# Patient Record
Sex: Female | Born: 1965 | Race: White | Hispanic: No | Marital: Married | Smoking: Former smoker
Health system: Southern US, Community
[De-identification: ages and names within clinical notes are randomized; demographics above are authoritative.]

## PROBLEM LIST (undated history)

## (undated) DIAGNOSIS — C801 Malignant (primary) neoplasm, unspecified: Secondary | ICD-10-CM

## (undated) DIAGNOSIS — Z803 Family history of malignant neoplasm of breast: Secondary | ICD-10-CM

## (undated) DIAGNOSIS — G971 Other reaction to spinal and lumbar puncture: Secondary | ICD-10-CM

## (undated) DIAGNOSIS — Z800 Family history of malignant neoplasm of digestive organs: Secondary | ICD-10-CM

## (undated) HISTORY — PX: BREAST ENHANCEMENT SURGERY: SHX7

## (undated) HISTORY — PX: ABDOMINAL HYSTERECTOMY: SHX81

## (undated) HISTORY — PX: BREAST SURGERY: SHX581

## (undated) HISTORY — PX: WISDOM TOOTH EXTRACTION: SHX21

## (undated) HISTORY — DX: Family history of malignant neoplasm of digestive organs: Z80.0

## (undated) HISTORY — PX: TONSILLECTOMY: SUR1361

## (undated) HISTORY — DX: Family history of malignant neoplasm of breast: Z80.3

---

## 2014-12-01 ENCOUNTER — Ambulatory Visit: Payer: Self-pay | Admitting: Family Medicine

## 2014-12-02 ENCOUNTER — Encounter: Payer: Self-pay | Admitting: Family Medicine

## 2015-01-11 HISTORY — PX: AUGMENTATION MAMMAPLASTY: SUR837

## 2015-01-19 ENCOUNTER — Other Ambulatory Visit: Payer: Self-pay | Admitting: Obstetrics and Gynecology

## 2015-01-19 DIAGNOSIS — N6325 Unspecified lump in the left breast, overlapping quadrants: Secondary | ICD-10-CM

## 2015-01-19 DIAGNOSIS — N6320 Unspecified lump in the left breast, unspecified quadrant: Principal | ICD-10-CM

## 2015-01-27 ENCOUNTER — Other Ambulatory Visit: Payer: Self-pay | Admitting: Obstetrics and Gynecology

## 2015-01-27 ENCOUNTER — Ambulatory Visit
Admission: RE | Admit: 2015-01-27 | Discharge: 2015-01-27 | Disposition: A | Discharge status: Home / Self Care | Payer: Federal, State, Local not specified - PPO | Source: Ambulatory Visit | Attending: Obstetrics and Gynecology | Admitting: Obstetrics and Gynecology

## 2015-01-27 DIAGNOSIS — N6489 Other specified disorders of breast: Secondary | ICD-10-CM

## 2015-01-27 DIAGNOSIS — N6320 Unspecified lump in the left breast, unspecified quadrant: Principal | ICD-10-CM

## 2015-01-27 DIAGNOSIS — N6325 Unspecified lump in the left breast, overlapping quadrants: Secondary | ICD-10-CM

## 2015-01-29 ENCOUNTER — Ambulatory Visit
Admission: RE | Admit: 2015-01-29 | Discharge: 2015-01-29 | Disposition: A | Discharge status: Home / Self Care | Payer: No Typology Code available for payment source | Source: Ambulatory Visit | Attending: Obstetrics and Gynecology | Admitting: Obstetrics and Gynecology

## 2015-01-29 ENCOUNTER — Other Ambulatory Visit: Payer: Self-pay

## 2015-01-29 ENCOUNTER — Ambulatory Visit
Admission: RE | Admit: 2015-01-29 | Discharge: 2015-01-29 | Disposition: A | Discharge status: Home / Self Care | Payer: Federal, State, Local not specified - PPO | Source: Ambulatory Visit | Attending: Obstetrics and Gynecology | Admitting: Obstetrics and Gynecology

## 2015-01-29 ENCOUNTER — Other Ambulatory Visit: Payer: Self-pay | Admitting: Obstetrics and Gynecology

## 2015-01-29 DIAGNOSIS — N6489 Other specified disorders of breast: Secondary | ICD-10-CM

## 2015-02-04 ENCOUNTER — Other Ambulatory Visit: Payer: Self-pay | Admitting: General Surgery

## 2015-02-04 ENCOUNTER — Encounter: Payer: Self-pay | Admitting: Genetic Counselor

## 2015-02-04 DIAGNOSIS — C50912 Malignant neoplasm of unspecified site of left female breast: Secondary | ICD-10-CM

## 2015-02-06 ENCOUNTER — Ambulatory Visit
Admission: RE | Admit: 2015-02-06 | Discharge: 2015-02-06 | Disposition: A | Discharge status: Home / Self Care | Payer: Federal, State, Local not specified - PPO | Source: Ambulatory Visit | Attending: General Surgery | Admitting: General Surgery

## 2015-02-06 MED ORDER — GADOBENATE DIMEGLUMINE 529 MG/ML IV SOLN
10.0000 mL | Freq: Once | INTRAVENOUS | Status: AC | PRN
  Administered 2015-02-06: 10 mL via INTRAVENOUS

## 2015-02-11 ENCOUNTER — Telehealth: Payer: Self-pay | Admitting: Hematology

## 2015-02-11 NOTE — Telephone Encounter (Signed)
new breast appt-s/w patient and gave np appt for 02/02 @ 11 w/Dr. Burr Medico.  Referring Dr. Marlou Starks Dx- Invasive breast ca/left breast.    Information scanned under media tab.

## 2015-02-12 ENCOUNTER — Ambulatory Visit (HOSPITAL_BASED_OUTPATIENT_CLINIC_OR_DEPARTMENT_OTHER): Payer: Federal, State, Local not specified - PPO | Admitting: Hematology

## 2015-02-12 ENCOUNTER — Encounter: Payer: Self-pay | Admitting: *Deleted

## 2015-02-12 ENCOUNTER — Encounter: Payer: Self-pay | Admitting: Hematology

## 2015-02-12 VITALS — BP 119/73 | HR 84 | Temp 98.9°F | Resp 17 | Ht 61.0 in | Wt 116.8 lb

## 2015-02-12 DIAGNOSIS — C50512 Malignant neoplasm of lower-outer quadrant of left female breast: Secondary | ICD-10-CM | POA: Insufficient documentation

## 2015-02-12 NOTE — Progress Notes (Signed)
[facility]  [phone] [phone]  Clinic New Consult Note   Patient Care Team: No Pcp Per Patient as PCP - General (General Practice) 02/12/2015  REFERRAL PHYSICIAN: Dr. Toth   CHIEF COMPLAINTS/PURPOSE OF CONSULTATION:  Newly diagnosed left breast cancer   HISTORY OF PRESENTING ILLNESS:  Joann Collins 50 y.o. female is here because of her newly diagnosed left breast cancer.   She felt a breast lump 2 month ago, no pain, skin change or discharge, nipple change. She otherwise feels well, denies any other symptoms. Her last screening mammogram was normal in April 2014. She moved from [place] to [place] about 6 months ago. Due to the breast lump, she went to see a gynecologist, and a mammogram was done on 01/27/2015. The mammogram and ultrasound showed a 1.1 cm cyst at 2:00 position which corresponding to the palpable mass, and an area of architectural distortion at the 2 to 3:00 position of the left breast. She underwent ultrasound-guided core needle biopsy of the left breast lesion at 3 clock position. She developed some pain at the biopsy site, and mild skin erythema, mild numbness of the left upper arm.   She was referred to see her breast surgeon Dr. Toth. Dr. Toth ordered a breast MRI and discussed the MRI findings with patient. Mastectomy was recommended which is also favored by patient.   MEDICAL HISTORY:  History reviewed. No pertinent past medical history.  SURGICAL HISTORY: Past Surgical History  Procedure Laterality Date  . Abdominal hysterectomy      GYN HISTORY  Menarchal: 14 LMP: 2000 before she had hysterectomy Contraceptive: 4 years  HRT: none  G2P2:   SOCIAL HISTORY: Social History   Social History  . Marital Status: Married    Spouse Name: N/A  . Number of Children:  she has a son and a daughter who is 19   . Years of Education: N/A   Occupational History  . Not on file.   Social History Main  Topics  . Smoking status: Former Smoker -- 1.00 packs/day for 30 years  . Smokeless tobacco: Not on file  . Alcohol Use: Yes     Comment: socail   . Drug Use: No  . Sexual Activity: Not on file   Other Topics Concern  . Not on file   Social History Narrative   She works for [employer], she is a customer service manager, she works from home   FAMILY HISTORY: Family History  Problem Relation Age of Onset  . Breast cancer  60  . Breast cancer Maternal Aunt 45  . Breast cancer Maternal Aunt 83    ALLERGIES:  is allergic to penicillins.  MEDICATIONS:  Current Outpatient Prescriptions  Medication Sig Dispense Refill  . acyclovir (ZOVIRAX) 800 MG tablet Take 800 mg by mouth daily.     No current facility-administered medications for this visit.    REVIEW OF SYSTEMS:   Constitutional: Denies fevers, chills or abnormal night sweats Eyes: Denies blurriness of vision, double vision or watery eyes Ears, nose, mouth, throat, and face: Denies mucositis or sore throat Respiratory: Denies cough, dyspnea or wheezes Cardiovascular: Denies palpitation, chest discomfort or lower extremity swelling Gastrointestinal:  Denies nausea, heartburn or change in bowel habits Skin: Denies abnormal skin rashes Lymphatics: Denies new lymphadenopathy or easy bruising Neurological:Denies numbness, tingling or new weaknesses Behavioral/Psych: Mood is stable, no new changes  All other systems were reviewed with the patient and are negative.  PHYSICAL EXAMINATION: ECOG PERFORMANCE   STATUS: 0 - Asymptomatic  Filed Vitals:   02/12/15 1049  BP: 119/73  Pulse: 84  Temp: 98.9 F (37.2 C)  Resp: 17   Filed Weights   02/12/15 1049  Weight: 116 lb 12.8 oz (52.98 kg)    GENERAL:alert, no distress and comfortable SKIN: skin color, texture, turgor are normal, no rashes or significant lesions EYES: normal, conjunctiva are pink and non-injected, sclera clear OROPHARYNX:no exudate, no erythema and lips,  buccal mucosa, and tongue normal  NECK: supple, thyroid normal size, non-tender, without nodularity LYMPH:  no palpable lymphadenopathy in the cervical, axillary or inguinal LUNGS: clear to auscultation and percussion with normal breathing effort HEART: regular rate & rhythm and no murmurs and no lower extremity edema ABDOMEN:abdomen soft, non-tender and normal bowel sounds Musculoskeletal:no cyanosis of digits and no clubbing  PSYCH: alert & oriented x 3 with fluent speech NEURO: no focal motor/sensory deficits Breasts: Breast inspection showed them to be symmetrical with no nipple discharge. (+) A small area of skin erythema and healing ulcer at the biopsy site. There is a small palpable lump at the 2-3 clock position of left breast, Palpation of the right breast and axilla revealed no obvious mass that I could appreciate.  LABORATORY DATA:  I have reviewed the data as listed No results found for: WBC, HGB, HCT, MCV, PLT No results for input(s): NA, K, CL, CO2, GLUCOSE, BUN, CREATININE, CALCIUM, GFRNONAA, GFRAA, PROT, ALBUMIN, AST, ALT, ALKPHOS, BILITOT, BILIDIR, IBILI in the last 8760 hours.  PATHOLOGY REPORT Diagnosis 01/29/2015 Breast, left, needle core biopsy, 3:00 o'clock LOW GRADE INVASIVE DUCTAL CARCINOMA, GRADE 1 DUCTAL CARCINOMA IN SITU IS PRESENT Microscopic Comment This case also reviewed by Dr. Kish and agree. Breast prognostic profile has been ordered and will issue as addendum. [facility] has been informed. (01/30/2015). PROGNOSTIC INDICATORS Results: IMMUNOHISTOCHEMICAL AND MORPHOMETRIC ANALYSIS PERFORMED MANUALLY Estrogen Receptor: 95%, POSITIVE, STRONG STAINING INTENSITY Progesterone Receptor: 80%, POSITIVE, STRONG STAINING INTENSITY Proliferation Marker Ki67: 3%  OF NOTE, THERE IS MINIMAL INVASIVE TUMOR PRESENT FOR EVALUATION.  Results: HER2 - NEGATIVE. OF NOTE, THERE ARE MINIMAL TUMOR CELLS PRESENT FOR HER2 EVALUATION. RATIO OF  HER2/CEP17 SIGNALS 1.22 AVERAGE HER2 COPY NUMBER PER CELL 1.91  RADIOGRAPHIC STUDIES: I have personally reviewed the radiological images as listed and agreed with the findings in the report.  Mr Breast Bilateral W Wo Contrast 02/06/2015   FINDINGS: Breast composition: c. Heterogeneous fibroglandular tissue. Background parenchymal enhancement: Moderate. Right breast: No mass or abnormal enhancement. Left breast: There is diffuse non mass like enhancement with plateau enhancement kinetics involving the left breast 3 to 5 o'clock position measuring 1.4 x 6.3 x 3.9 cm with associated clip consistent with patient's known cancer. The enhancement extends to the skin laterally with mild enhancement of adjacent skin, extension extends to the skin laterally with mild enhancement of the adjacent skin, extension of tumor to the skin is not excluded. Lymph nodes: No abnormal appearing lymph nodes. Ancillary findings:  None.  Bilateral implants are noted. IMPRESSION: Diffuse non masslike enhancement measuring 1.4 x 6.3 x 3.9 cm at the left breast 3 to 5 o'clock position correlating to the patient's known cancer. The enhancement extends to the skin laterally with mild enhancement of adjacent skin, tumor extension in the skin is not excluded. RECOMMENDATION: Treatment plan. BI-RADS CATEGORY  6: Known biopsy-proven malignancy. Electronically Signed   By: Wei-Chen  Lin M.D.   On: 02/06/2015 15:15   Us Breast Ltd Uni Left Inc Axilla 01/27/2015     IMPRESSION: 1. Architectural distortion within the outer LEFT breast, 3 o'clock axis region, at middle depth, slightly inferior to the site of a palpable left breast cyst. This distortion is a suspicious finding for which stereotactic-guided biopsy is recommended. 2. Benign left breast cyst at the 2 o'clock axis, 2 cm from the nipple, corresponding to the palpable lump. 3. Additional benign cysts incidentally noted within the right breast. RECOMMENDATION: Stereotactic-guided biopsy,  with tomosynthesis guidance, for the architectural distortion in the outer LEFT breast. Stereotactic-guided biopsy is scheduled for January 19th at 9 a.m. I have discussed the findings and recommendations with the patient. Results were also provided in writing at the conclusion of the visit. If applicable, a reminder letter will be sent to the patient regarding the next appointment. BI-RADS CATEGORY  4: Suspicious. Electronically Signed   By: Franki Cabot M.D.   On: 01/27/2015 17:04    ASSESSMENT & PLAN:  50 year old Caucasian female, presented with a palpable left breast lump.   1. 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 -Her palpable left breast lump is a benign cyst, mammogram showed a small area of architectural distortion, which responding to a larger non-mass enhancement on MRI, measuring 1.4 x 6.3 x 3.9 cm. -I discussed her core needle biopsy results in details. Which showed both DCIS and low-grade invasive ductal carcinoma, ER PR positive and HER-2 negative. I spoke with the pathologist Dr. Orene Desanctis this morning, she feels the invasive carcinoma is about 40% of the biopsy sample. -Although the size of tumor appears to be large on MRI, it is low-grade, may not respond to chemotherapy well. Patient prefers to have mastectomy anyway, so I would recommend upfront surgery. -I recommend her to have Oncotype test on her surgical tumor tissue, to further evaluate the risk of cancer recurrence after surgery, and the benefit of adjuvant chemotherapy. Given the low-grade tumor and the very low Ki-67, I suspect her recurrence score will be in low risk. -If her sentinel lymph node is positive, I would recommend mammaprint, to evaluate the risk of recurrence and benefit of adjuvant  chemotherapy. -Depends on the final size of invasive component in the tumor, she may or may not need post mastectomy radiation. She prefers to hold on radiation oncology referral for now. -She is planning to have immediate reconstruction after mastectomy, is scheduled to see plastic surgeon next Monday. -Given her strong ER and PR positivity of the tumor cells, I recommend adjuvant endocrine therapy with tamoxifen. She had hysterectomy, no clinical symptoms of menopause, based on her age, presumably she is still pre-menopausal.   2. Genetics -She has 3 second generation relatives with history of breast cancer, one was 96 at his diagnosis. I recommend her to have genetic testing to ruled out inheritable breast and ovary syndrome. -She is very interested, however she is overwhelmed by the diagnosis and prefers to have surgery done as soon as possible, she would like to see genetic counselor after her surgery. -We discussed the possible impact of genetic findings on her surgery if she was found to have pathological gene mutations, she voiced good understanding, but still does not want to have the genetic test done before her surgery.   Plan -She is likely going to proceed with left breast mastectomy with immediate reconstruction -She is agreeable to see radiation oncologist if needed after  surgery -We'll refer her to genetic counselor after her surgery -I'll send her surgical invasive tumor for Oncotype (if node neg) or Mammaprint (if node positive) -I'll see her back 2-3 weeks after her surgery.  All questions were answered. The patient knows to call the clinic with any problems, questions or concerns. I spent 55 minutes counseling the patient face to face. The total time spent in the appointment was 60 minutes and more than 50% was on counseling.     Truitt Merle, MD 02/12/2015 8:09 PM

## 2015-02-17 ENCOUNTER — Other Ambulatory Visit: Payer: Self-pay | Admitting: General Surgery

## 2015-02-17 DIAGNOSIS — C50412 Malignant neoplasm of upper-outer quadrant of left female breast: Secondary | ICD-10-CM

## 2015-02-19 ENCOUNTER — Telehealth: Payer: Self-pay

## 2015-02-19 NOTE — Telephone Encounter (Signed)
Pt LMOVM - she would like to speak with Dr. Burr Medico only about her breast cancer.  Reports she has seen plastic surgeon and is getting conflicting information.  Routed to Pod 1

## 2015-02-19 NOTE — Telephone Encounter (Signed)
I called her back. She is very  His appointment is at plastic surgeon Dr. Iran Planas did not offer her immediate reconstruction. She does not want radiation. She wanted second  Opinion from Dr. Sonny Dandy yesterday,  But she does not want it any more today.   Truitt Merle  02/19/2015

## 2015-02-24 ENCOUNTER — Other Ambulatory Visit: Payer: Self-pay | Admitting: *Deleted

## 2015-02-25 ENCOUNTER — Telehealth: Payer: Self-pay | Admitting: Hematology

## 2015-02-25 NOTE — Telephone Encounter (Signed)
Spoke with patient re f/u for 3/20

## 2015-02-25 NOTE — H&P (Signed)
  Subjective:    Patient ID: Joann Collins is a 50 y.o. female.  HPI  Patient of Drs. Burr Medico and Marlou Starks here for consultation for breast reconstruction. Presented with palpable mass. MMG and US showed a 1.1 cm cyst at 2:00 position which corresponding to the palpable mass, and an area of architectural distortion at the 2 to 3:00 position of the left breast. Biopsy of the left breast lesion at 3 clock position revealed IDC with DCIS, ER/PR+, Her 2 -. MRI showed diffuse NME measuring 1.4 x 6.3 x 3.9 cm at the left breast 3 to 5 o'clock position. Breast density C. The enhancement extends to the skin laterally with mild enhancement of adjacent skin, tumor extension in the skin is not excluded. Mastectomy has been recommended. Patient declined genetics referral prior to surgery.   History significant for submuscular augmentation with saline. Prior to augmentation A, Current B. Has implant card at home, happy with current size, shape. Wt stable  Review of Systems    12 point review negative Objective:   Physical Exam  Constitutional: She is oriented to person, place, and time.  Cardiovascular: Normal rate.  Pulmonary/Chest: Effort normal.  Neurological: She is alert and oriented to person, place, and time.   Bilateral IMF scars which have migrated onto breast, soft no contracture   SN to nipple R 21 L 20 cm BW R 12 L 13 cm Nipple to IMF R 8 L 9 cm Assessment:     Left breast cancer Family history breast cancer- declined genetic referral prior to surgery History breast augmentation    Plan:     We reviewed MRI findings and likely Dr. Marlou Starks will want to resect skin of concern on MRI. Discussed it is a possibility she may require radiation given extent on MRI, but we will not know until final pathology available.  Discussed autologous vs implant based reconstruction, it would be difficult to offer her the same size or projection as she has now without implants. Reviewed that with the  anticipated skin resection of mastectomy flaps, would recommend expander placement over DTI. Reviewed hospital stay, drains, process of expansion. Reviewed risk of radiation on reconstruction especially with regards to capsular contracture and wound healing. She understood risk of capsular contracture with radiation and she had desired mastectomy even prior to MRI to avoid this risk. Reviewed if expander in place and pathology indicates need for radiation, she would still be able to receive XRT and proceed with reconstruction though timing of procedures would be affected. Alternate would be to delay reconstruction. Patient understands and desire expander, possible acellular dermis at time of mastectomy.  Irene Limbo, MD [facility]astic [facility] Reconstruct[facility] Surgery [phone]

## 2015-03-02 NOTE — Pre-Procedure Instructions (Signed)
    QUINTERIA WOODMANSEE  03/02/2015      [facility] - [place], [place] - [address] [place] [address] Phone: [phone] Fax: [phone]    Your procedure is scheduled on Feb. 27  Report to [facility] Admitting [phone] A.M.  Call this number if you have problems the morning of surgery:  [phone]   Remember:  Do not eat food or drink liquids after midnight.  Take these medicines the morning of surgery with A SIP OF WATER : acyclovir (ZOVIRAX)   Stop taking aspirin, Ibuprofen, Advil, Motrin, aleve, BC's, Goody's, Herbal medications, Fish Oil   Do not wear jewelry, make-up or nail polish.  Do not wear lotions, powders, or perfumes.  You may wear deodorant.  Do not shave 48 hours prior to surgery.  Men may shave face and neck.  Do not bring valuables to the hospital.  [facility] is not responsible for any belongings or valuables.  Contacts, dentures or bridgework may not be worn into surgery.  Leave your suitcase in the car.  After surgery it may be brought to your room.  For patients admitted to the hospital, discharge time will be determined by your treatment team.  Patients discharged the day of surgery will not be allowed to drive home.    Please read over the following fact sheets that you were given. Pain Booklet, Coughing and Deep Breathing and Surgical Site Infection Prevention

## 2015-03-03 ENCOUNTER — Inpatient Hospital Stay (HOSPITAL_COMMUNITY)
Admission: RE | Admit: 2015-03-03 | Discharge: 2015-03-03 | Disposition: A | Discharge status: Home / Self Care | Payer: Federal, State, Local not specified - PPO | Source: Ambulatory Visit

## 2015-03-03 NOTE — Progress Notes (Signed)
Pt was no show for 03/03/15 1000 pre admit appointment.  Left voicemail on pt home phone to call preadmit at [phone]

## 2015-03-05 ENCOUNTER — Encounter (HOSPITAL_COMMUNITY): Payer: Self-pay | Admitting: *Deleted

## 2015-03-05 NOTE — Progress Notes (Signed)
Lab results are on pt chart.

## 2015-03-05 NOTE — Progress Notes (Signed)
Pt denies SOB, chest pain, and being under the care of a cardiologist. Pt denies having a stress test, echo and cardiac cath. Pt denies having a chest x ray and EKG within the last year. Pt stated that pre-op labs were done at PCP and faxed to MD on 02/25/15. [facility], was transferred to  medical records and they have no record of receiving fax; records were then requested from PCP that ordered labs via fax. Pt made aware to stop taking Aspirin, otc vitamins, fish oil and herbal medications. Do not take any NSAIDs ie: Ibuprofen, Advil, Naproxen, BC and Goody Powder or any medication containing Aspirin. Pt verbalized understanding of all pre-op instructions.

## 2015-03-08 MED ORDER — VANCOMYCIN HCL IN DEXTROSE 1-5 GM/200ML-% IV SOLN
1000.0000 mg | INTRAVENOUS | Status: DC
  Filled 2015-03-08: qty 200

## 2015-03-09 ENCOUNTER — Ambulatory Visit (HOSPITAL_COMMUNITY): Payer: Federal, State, Local not specified - PPO | Admitting: Certified Registered Nurse Anesthetist

## 2015-03-09 ENCOUNTER — Encounter (HOSPITAL_COMMUNITY): Payer: Self-pay | Admitting: Anesthesiology

## 2015-03-09 ENCOUNTER — Ambulatory Visit (HOSPITAL_COMMUNITY)
Admission: RE | Admit: 2015-03-09 | Discharge: 2015-03-10 | Disposition: A | Discharge status: Home / Self Care | Payer: Federal, State, Local not specified - PPO | Source: Ambulatory Visit | Attending: General Surgery | Admitting: General Surgery

## 2015-03-09 ENCOUNTER — Encounter (HOSPITAL_COMMUNITY)
Admission: RE | Admit: 2015-03-09 | Discharge: 2015-03-09 | Disposition: A | Discharge status: Home / Self Care | Payer: Federal, State, Local not specified - PPO | Source: Ambulatory Visit | Attending: General Surgery | Admitting: General Surgery

## 2015-03-09 ENCOUNTER — Encounter (HOSPITAL_COMMUNITY)
Admission: RE | Disposition: A | Discharge status: Home / Self Care | Payer: Self-pay | Source: Ambulatory Visit | Attending: General Surgery

## 2015-03-09 DIAGNOSIS — C50412 Malignant neoplasm of upper-outer quadrant of left female breast: Secondary | ICD-10-CM | POA: Insufficient documentation

## 2015-03-09 DIAGNOSIS — Z803 Family history of malignant neoplasm of breast: Secondary | ICD-10-CM | POA: Diagnosis not present

## 2015-03-09 DIAGNOSIS — C50912 Malignant neoplasm of unspecified site of left female breast: Secondary | ICD-10-CM | POA: Diagnosis present

## 2015-03-09 DIAGNOSIS — Z170 Estrogen receptor positive status [ER+]: Secondary | ICD-10-CM | POA: Insufficient documentation

## 2015-03-09 DIAGNOSIS — I9581 Postprocedural hypotension: Secondary | ICD-10-CM | POA: Insufficient documentation

## 2015-03-09 DIAGNOSIS — Z87891 Personal history of nicotine dependence: Secondary | ICD-10-CM | POA: Diagnosis not present

## 2015-03-09 HISTORY — PX: BREAST RECONSTRUCTION WITH PLACEMENT OF TISSUE EXPANDER AND FLEX HD (ACELLULAR HYDRATED DERMIS): SHX6295

## 2015-03-09 HISTORY — DX: Other reaction to spinal and lumbar puncture: G97.1

## 2015-03-09 HISTORY — PX: MASTECTOMY W/ SENTINEL NODE BIOPSY: SHX2001

## 2015-03-09 HISTORY — DX: Malignant (primary) neoplasm, unspecified: C80.1

## 2015-03-09 HISTORY — PX: MASTECTOMY: SHX3

## 2015-03-09 SURGERY — MASTECTOMY WITH SENTINEL LYMPH NODE BIOPSY
Anesthesia: Regional | Site: Breast | Laterality: Left

## 2015-03-09 MED ORDER — FENTANYL CITRATE (PF) 100 MCG/2ML IJ SOLN
INTRAMUSCULAR | Status: AC
  Administered 2015-03-09: 100 ug
  Filled 2015-03-09: qty 2

## 2015-03-09 MED ORDER — MIDAZOLAM HCL 2 MG/2ML IJ SOLN
INTRAMUSCULAR | Status: AC
  Filled 2015-03-09: qty 2

## 2015-03-09 MED ORDER — SULFAMETHOXAZOLE-TRIMETHOPRIM 800-160 MG PO TABS: 1.0000 | ORAL_TABLET | Freq: Two times a day (BID) | ORAL | Status: DC

## 2015-03-09 MED ORDER — HEPARIN SODIUM (PORCINE) 5000 UNIT/ML IJ SOLN: 5000.0000 [IU] | Freq: Three times a day (TID) | INTRAMUSCULAR | Status: DC

## 2015-03-09 MED ORDER — ONDANSETRON HCL 4 MG/2ML IJ SOLN
INTRAMUSCULAR | Status: DC | PRN
  Administered 2015-03-09: 4 mg via INTRAVENOUS

## 2015-03-09 MED ORDER — CHLORHEXIDINE GLUCONATE 4 % EX LIQD: 1.0000 "application " | Freq: Once | CUTANEOUS | Status: DC

## 2015-03-09 MED ORDER — OXYCODONE-ACETAMINOPHEN 5-325 MG PO TABS
1.0000 | ORAL_TABLET | ORAL | Status: DC | PRN
  Administered 2015-03-09: 1 via ORAL
  Administered 2015-03-10 (×3): 2 via ORAL
  Filled 2015-03-09: qty 1
  Filled 2015-03-09 (×3): qty 2

## 2015-03-09 MED ORDER — DEXTROSE 5 % IV SOLN
Freq: Three times a day (TID) | INTRAVENOUS | Status: DC
  Administered 2015-03-09 – 2015-03-10 (×2): via INTRAVENOUS
  Filled 2015-03-09 (×4): qty 50

## 2015-03-09 MED ORDER — HYDROMORPHONE HCL 1 MG/ML IJ SOLN
0.2500 mg | INTRAMUSCULAR | Status: DC | PRN
  Administered 2015-03-09 (×2): 0.5 mg via INTRAVENOUS

## 2015-03-09 MED ORDER — LACTATED RINGERS IV SOLN: INTRAVENOUS | Status: DC | PRN

## 2015-03-09 MED ORDER — FENTANYL CITRATE (PF) 250 MCG/5ML IJ SOLN
INTRAMUSCULAR | Status: AC
  Filled 2015-03-09: qty 5

## 2015-03-09 MED ORDER — MORPHINE SULFATE (PF) 2 MG/ML IV SOLN
1.0000 mg | INTRAVENOUS | Status: DC | PRN
  Administered 2015-03-09: 4 mg via INTRAVENOUS
  Filled 2015-03-09: qty 2

## 2015-03-09 MED ORDER — PROPOFOL 10 MG/ML IV BOLUS
INTRAVENOUS | Status: AC
  Filled 2015-03-09: qty 20

## 2015-03-09 MED ORDER — DIPHENHYDRAMINE HCL 50 MG/ML IJ SOLN
INTRAMUSCULAR | Status: DC | PRN
  Administered 2015-03-09: 25 mg via INTRAVENOUS

## 2015-03-09 MED ORDER — ONDANSETRON HCL 4 MG/2ML IJ SOLN
INTRAMUSCULAR | Status: AC
  Filled 2015-03-09: qty 2

## 2015-03-09 MED ORDER — LIDOCAINE HCL (CARDIAC) 20 MG/ML IV SOLN
INTRAVENOUS | Status: AC
  Filled 2015-03-09: qty 5

## 2015-03-09 MED ORDER — ONDANSETRON 4 MG PO TBDP
4.0000 mg | ORAL_TABLET | Freq: Four times a day (QID) | ORAL | Status: DC | PRN
  Filled 2015-03-09: qty 1

## 2015-03-09 MED ORDER — SODIUM CHLORIDE 0.9 % IV SOLN
1000.0000 mg | INTRAVENOUS | Status: DC | PRN
  Administered 2015-03-09: 1000 mg via INTRAVENOUS

## 2015-03-09 MED ORDER — ROCURONIUM BROMIDE 100 MG/10ML IV SOLN
INTRAVENOUS | Status: DC | PRN
  Administered 2015-03-09: 50 mg via INTRAVENOUS

## 2015-03-09 MED ORDER — ONDANSETRON HCL 4 MG/2ML IJ SOLN
4.0000 mg | Freq: Four times a day (QID) | INTRAMUSCULAR | Status: DC | PRN
  Administered 2015-03-09: 4 mg via INTRAVENOUS
  Filled 2015-03-09: qty 2

## 2015-03-09 MED ORDER — CLINDAMYCIN PHOSPHATE 600 MG/50ML IV SOLN
600.0000 mg | Freq: Three times a day (TID) | INTRAVENOUS | Status: DC
  Filled 2015-03-09 (×5): qty 50

## 2015-03-09 MED ORDER — PROPOFOL 10 MG/ML IV BOLUS
INTRAVENOUS | Status: DC | PRN
  Administered 2015-03-09: 100 mg via INTRAVENOUS

## 2015-03-09 MED ORDER — DEXAMETHASONE SODIUM PHOSPHATE 4 MG/ML IJ SOLN
INTRAMUSCULAR | Status: DC | PRN
  Administered 2015-03-09: 4 mg via INTRAVENOUS

## 2015-03-09 MED ORDER — METHOCARBAMOL 500 MG PO TABS
500.0000 mg | ORAL_TABLET | Freq: Four times a day (QID) | ORAL | Status: DC | PRN
Start: 2015-03-09 — End: 2015-03-10
  Administered 2015-03-09 – 2015-03-10 (×2): 500 mg via ORAL
  Filled 2015-03-09 (×2): qty 1

## 2015-03-09 MED ORDER — KCL IN DEXTROSE-NACL 20-5-0.9 MEQ/L-%-% IV SOLN
INTRAVENOUS | Status: DC
  Administered 2015-03-09: 19:00:00 via INTRAVENOUS
  Filled 2015-03-09 (×3): qty 1000

## 2015-03-09 MED ORDER — PANTOPRAZOLE SODIUM 40 MG IV SOLR
40.0000 mg | Freq: Every day | INTRAVENOUS | Status: DC
  Administered 2015-03-09: 40 mg via INTRAVENOUS
  Filled 2015-03-09: qty 40

## 2015-03-09 MED ORDER — DEXTROSE 5 % IV SOLN
10.0000 mg | INTRAVENOUS | Status: DC | PRN
  Administered 2015-03-09: 25 ug/min via INTRAVENOUS

## 2015-03-09 MED ORDER — TECHNETIUM TC 99M SULFUR COLLOID FILTERED
1.0000 | Freq: Once | INTRAVENOUS | Status: AC | PRN
  Administered 2015-03-09: 1 via INTRADERMAL

## 2015-03-09 MED ORDER — SODIUM CHLORIDE 0.9 % IR SOLN
Status: DC | PRN
  Administered 2015-03-09: 500 mL

## 2015-03-09 MED ORDER — MIDAZOLAM HCL 2 MG/2ML IJ SOLN
INTRAMUSCULAR | Status: AC
  Administered 2015-03-09: 2 mg
  Filled 2015-03-09: qty 2

## 2015-03-09 MED ORDER — 0.9 % SODIUM CHLORIDE (POUR BTL) OPTIME
TOPICAL | Status: DC | PRN
  Administered 2015-03-09 (×2): 1000 mL

## 2015-03-09 MED ORDER — MIDAZOLAM HCL 5 MG/5ML IJ SOLN
INTRAMUSCULAR | Status: DC | PRN
  Administered 2015-03-09: 2 mg via INTRAVENOUS

## 2015-03-09 MED ORDER — HYDROMORPHONE HCL 1 MG/ML IJ SOLN
INTRAMUSCULAR | Status: AC
  Filled 2015-03-09: qty 1

## 2015-03-09 MED ORDER — FENTANYL CITRATE (PF) 100 MCG/2ML IJ SOLN
INTRAMUSCULAR | Status: DC | PRN
  Administered 2015-03-09 (×5): 50 ug via INTRAVENOUS

## 2015-03-09 MED ORDER — BUPIVACAINE-EPINEPHRINE (PF) 0.5% -1:200000 IJ SOLN
INTRAMUSCULAR | Status: DC | PRN
  Administered 2015-03-09: 30 mL

## 2015-03-09 MED ORDER — LIDOCAINE HCL (CARDIAC) 20 MG/ML IV SOLN
INTRAVENOUS | Status: DC | PRN
  Administered 2015-03-09: 40 mg via INTRAVENOUS

## 2015-03-09 MED ORDER — PHENYLEPHRINE HCL 10 MG/ML IJ SOLN
INTRAMUSCULAR | Status: DC | PRN
  Administered 2015-03-09 (×3): 80 ug via INTRAVENOUS
  Administered 2015-03-09 (×2): 40 ug via INTRAVENOUS

## 2015-03-09 MED ORDER — LACTATED RINGERS IV SOLN
INTRAVENOUS | Status: DC
  Administered 2015-03-09 (×2): via INTRAVENOUS

## 2015-03-09 SURGICAL SUPPLY — 88 items
ALLOGRAFT DERM CORTIV 4CMX12CM (Tissue Mesh) ×1 IMPLANT
ALLOGRAFT DERM CORTIV 4X12 (Tissue Mesh) ×2 IMPLANT
APPLIER CLIP 9.375 MED OPEN (MISCELLANEOUS) ×3
BAG DECANTER FOR FLEXI CONT (MISCELLANEOUS) ×3 IMPLANT
BINDER BREAST LRG (GAUZE/BANDAGES/DRESSINGS) ×3 IMPLANT
BINDER BREAST XLRG (GAUZE/BANDAGES/DRESSINGS) IMPLANT
BIOPATCH RED 1 DISK 7.0 (GAUZE/BANDAGES/DRESSINGS) IMPLANT
BIOPATCH RED 1IN DISK 7.0MM (GAUZE/BANDAGES/DRESSINGS)
CANISTER SUCTION 2500CC (MISCELLANEOUS) ×6 IMPLANT
CHLORAPREP W/TINT 26ML (MISCELLANEOUS) ×6 IMPLANT
CLIP APPLIE 9.375 MED OPEN (MISCELLANEOUS) ×1 IMPLANT
CLOSURE STERI-STRIP 1/2X4 (GAUZE/BANDAGES/DRESSINGS) ×1
CLSR STERI-STRIP ANTIMIC 1/2X4 (GAUZE/BANDAGES/DRESSINGS) ×2 IMPLANT
CONT SPEC 4OZ CLIKSEAL STRL BL (MISCELLANEOUS) ×12 IMPLANT
COVER PROBE W GEL 5X96 (DRAPES) ×3 IMPLANT
COVER SURGICAL LIGHT HANDLE (MISCELLANEOUS) ×3 IMPLANT
DEVICE DISSECT PLASMABLAD 3.0S (MISCELLANEOUS) ×1 IMPLANT
DRAIN CHANNEL 15F RND FF W/TCR (WOUND CARE) IMPLANT
DRAIN CHANNEL 19F RND (DRAIN) ×6 IMPLANT
DRAPE INCISE IOBAN 66X45 STRL (DRAPES) IMPLANT
DRAPE LAPAROSCOPIC ABDOMINAL (DRAPES) IMPLANT
DRAPE ORTHO SPLIT 77X108 STRL (DRAPES) ×4
DRAPE PROXIMA HALF (DRAPES) ×3 IMPLANT
DRAPE SURG 17X23 STRL (DRAPES) ×3 IMPLANT
DRAPE SURG ORHT 6 SPLT 77X108 (DRAPES) ×2 IMPLANT
DRAPE UTILITY XL STRL (DRAPES) ×3 IMPLANT
DRAPE WARM FLUID 44X44 (DRAPE) ×3 IMPLANT
DRSG PAD ABDOMINAL 8X10 ST (GAUZE/BANDAGES/DRESSINGS) ×6 IMPLANT
DRSG TEGADERM 2-3/8X2-3/4 SM (GAUZE/BANDAGES/DRESSINGS) ×3 IMPLANT
DRSG TEGADERM 4X4.75 (GAUZE/BANDAGES/DRESSINGS) IMPLANT
ELECT BLADE 4.0 EZ CLEAN MEGAD (MISCELLANEOUS) ×3
ELECT CAUTERY BLADE 6.4 (BLADE) ×3 IMPLANT
ELECT COATED BLADE 2.86 ST (ELECTRODE) IMPLANT
ELECT REM PT RETURN 9FT ADLT (ELECTROSURGICAL) ×6
ELECTRODE BLDE 4.0 EZ CLN MEGD (MISCELLANEOUS) ×1 IMPLANT
ELECTRODE REM PT RTRN 9FT ADLT (ELECTROSURGICAL) ×2 IMPLANT
EVACUATOR SILICONE 100CC (DRAIN) ×3 IMPLANT
EXPANDER TISSUE MX 300CC (Breast) ×1 IMPLANT
GAUZE SPONGE 4X4 12PLY STRL (GAUZE/BANDAGES/DRESSINGS) IMPLANT
GAUZE XEROFORM 5X9 LF (GAUZE/BANDAGES/DRESSINGS) IMPLANT
GLOVE BIO SURGEON STRL SZ 6 (GLOVE) ×6 IMPLANT
GLOVE BIO SURGEON STRL SZ7.5 (GLOVE) ×3 IMPLANT
GLOVE BIOGEL PI IND STRL 7.0 (GLOVE) ×1 IMPLANT
GLOVE BIOGEL PI IND STRL 8 (GLOVE) ×4 IMPLANT
GLOVE BIOGEL PI INDICATOR 7.0 (GLOVE) ×2
GLOVE BIOGEL PI INDICATOR 8 (GLOVE) ×8
GOWN STRL REUS W/ TWL LRG LVL3 (GOWN DISPOSABLE) ×4 IMPLANT
GOWN STRL REUS W/TWL LRG LVL3 (GOWN DISPOSABLE) ×8
KIT BASIN OR (CUSTOM PROCEDURE TRAY) ×6 IMPLANT
KIT ROOM TURNOVER OR (KITS) ×6 IMPLANT
LIQUID BAND (GAUZE/BANDAGES/DRESSINGS) ×3 IMPLANT
MARKER SKIN DUAL TIP RULER LAB (MISCELLANEOUS) ×3 IMPLANT
NEEDLE 18GX1X1/2 (RX/OR ONLY) (NEEDLE) IMPLANT
NEEDLE HYPO 25GX1X1/2 BEV (NEEDLE) IMPLANT
NS IRRIG 1000ML POUR BTL (IV SOLUTION) ×6 IMPLANT
PACK GENERAL/GYN (CUSTOM PROCEDURE TRAY) ×3 IMPLANT
PAD ARMBOARD 7.5X6 YLW CONV (MISCELLANEOUS) ×3 IMPLANT
PIN SAFETY STERILE (MISCELLANEOUS) IMPLANT
PLASMABLADE 3.0S (MISCELLANEOUS) ×3
SET ASEPTIC TRANSFER (MISCELLANEOUS) ×3 IMPLANT
SOLUTION BETADINE 4OZ (MISCELLANEOUS) ×3 IMPLANT
SPECIMEN JAR LARGE (MISCELLANEOUS) ×3 IMPLANT
SPECIMEN JAR X LARGE (MISCELLANEOUS) IMPLANT
SPONGE GAUZE 4X4 12PLY STER LF (GAUZE/BANDAGES/DRESSINGS) ×3 IMPLANT
STAPLER VISISTAT 35W (STAPLE) IMPLANT
SUT ETHILON 2 0 FS 18 (SUTURE) ×3 IMPLANT
SUT ETHILON 3 0 FSL (SUTURE) IMPLANT
SUT MNCRL 3 0 RB1 (SUTURE) IMPLANT
SUT MNCRL AB 4-0 PS2 18 (SUTURE) ×3 IMPLANT
SUT MON AB 4-0 PC3 18 (SUTURE) ×3 IMPLANT
SUT MON AB 5-0 PS2 18 (SUTURE) IMPLANT
SUT MONOCRYL 3 0 RB1 (SUTURE)
SUT VIC AB 3-0 54X BRD REEL (SUTURE) IMPLANT
SUT VIC AB 3-0 BRD 54 (SUTURE)
SUT VIC AB 3-0 SH 18 (SUTURE) ×3 IMPLANT
SUT VIC AB 3-0 SH 27 (SUTURE) ×6
SUT VIC AB 3-0 SH 27X BRD (SUTURE) ×3 IMPLANT
SUT VIC AB 4-0 PS2 27 (SUTURE) IMPLANT
SUT VICRYL 3 0 (SUTURE) IMPLANT
SUT VICRYL 4-0 PS2 18IN ABS (SUTURE) ×3 IMPLANT
SYR BULB IRRIGATION 50ML (SYRINGE) ×3 IMPLANT
SYR CONTROL 10ML LL (SYRINGE) IMPLANT
TISSUE EXPANDER MX 300CC (Breast) ×3 IMPLANT
TOWEL OR 17X24 6PK STRL BLUE (TOWEL DISPOSABLE) ×3 IMPLANT
TOWEL OR 17X26 10 PK STRL BLUE (TOWEL DISPOSABLE) ×3 IMPLANT
TRAY FOLEY CATH 16FRSI W/METER (SET/KITS/TRAYS/PACK) ×3 IMPLANT
TUBE CONNECTING 12'X1/4 (SUCTIONS) ×2
TUBE CONNECTING 12X1/4 (SUCTIONS) ×4 IMPLANT

## 2015-03-09 NOTE — Transfer of Care (Signed)
Immediate Anesthesia Transfer of Care Note  Patient: Joann Collins  Procedure(s) Performed: Procedure(s): LEFT MASTECTOMY WITH SENTINEL LYMPH NODE MAPPING (Left) LEFT BREAST RECONSTRUCTION WITH PLACEMENT OF TISSUE EXPANDER AND POSSIBLE ACELLULAR DERMIS (Left)  Patient Location: PACU  Anesthesia Type:General  Level of Consciousness: awake  Airway & Oxygen Therapy: Patient Spontanous Breathing and Patient connected to nasal cannula oxygen  Post-op Assessment: Report given to RN and Post -op Vital signs reviewed and stable  Post vital signs: Reviewed and stable  Last Vitals:  Filed Vitals:   03/09/15 1210 03/09/15 1215  BP: 105/58 96/69  Pulse: 81 78  Temp:    Resp: 23 18    Complications: No apparent anesthesia complications

## 2015-03-09 NOTE — Anesthesia Postprocedure Evaluation (Signed)
Anesthesia Post Note  Patient: Joann Collins  Procedure(s) Performed: Procedure(s) (LRB): LEFT MASTECTOMY WITH SENTINEL LYMPH NODE MAPPING (Left) LEFT BREAST RECONSTRUCTION WITH PLACEMENT OF TISSUE EXPANDER AND POSSIBLE ACELLULAR DERMIS (Left)  Patient location during evaluation: PACU Anesthesia Type: General Level of consciousness: awake and alert Pain control: Pain being addressed.  Vital Signs Assessment: post-procedure vital signs reviewed and stable Respiratory status: spontaneous breathing, nonlabored ventilation, respiratory function stable and patient connected to nasal cannula oxygen Cardiovascular status: blood pressure returned to baseline and stable Postop Assessment: no signs of nausea or vomiting Anesthetic complications: no    Last Vitals:  Filed Vitals:   03/09/15 1530 03/09/15 1600  BP: 106/82 99/81  Pulse: 71   Temp: 36.9 C   Resp: 16     Last Pain:  Filed Vitals:   03/09/15 1632  PainSc: [name]

## 2015-03-09 NOTE — Anesthesia Preprocedure Evaluation (Addendum)
Anesthesia Evaluation  Patient identified by MRN, date of birth, ID band Patient awake    Reviewed: Allergy & Precautions, H&P , NPO status , Patient's Chart, lab work & pertinent test results  Airway Mallampati: II  TM Distance: >3 FB Neck ROM: Full    Dental no notable dental hx. (+) Teeth Intact, Dental Advisory Given   Pulmonary neg pulmonary ROS, former smoker,    Pulmonary exam normal breath sounds clear to auscultation       Cardiovascular negative cardio ROS   Rhythm:Regular Rate:Normal     Neuro/Psych  Headaches, negative psych ROS   GI/Hepatic negative GI ROS, Neg liver ROS,   Endo/Other  negative endocrine ROS  Renal/GU negative Renal ROS  negative genitourinary   Musculoskeletal   Abdominal   Peds  Hematology negative hematology ROS (+)   Anesthesia Other Findings   Reproductive/Obstetrics negative OB ROS                            Anesthesia Physical Anesthesia Plan  ASA: II  Anesthesia Plan: General and Regional   Post-op Pain Management: GA combined w/ Regional for post-op pain   Induction: Intravenous  Airway Management Planned: Oral ETT  Additional Equipment:   Intra-op Plan:   Post-operative Plan: Extubation in OR  Informed Consent: I have reviewed the patients History and Physical, chart, labs and discussed the procedure including the risks, benefits and alternatives for the proposed anesthesia with the patient or authorized representative who has indicated his/her understanding and acceptance.   Dental advisory given  Plan Discussed with: CRNA  Anesthesia Plan Comments:        Anesthesia Quick Evaluation

## 2015-03-09 NOTE — Op Note (Signed)
Operative Note   DATE OF OPERATION: 2.27.17  LOCATION: [facility] - observation  SURGICAL DIVISION: Plastic Surgery  PREOPERATIVE DIAGNOSES:  1. Left breast DCIS 2. History breast augmentation  POSTOPERATIVE DIAGNOSES:  same  PROCEDURE:  1. Left breast reconstruction with tissue expander 2. Application acellular dermis (Cortiva) for breast reconstruction, 55 cm2.   SURGEON: Irene Limbo MD MBA  ASSISTANT: none  ANESTHESIA:  General.   EBL: 50 ml for entire case  COMPLICATIONS: None immediate.   INDICATIONS FOR PROCEDURE:  The patient, Joann Collins, is a 50 y.o. female born on 10/09/1965, is here for immediate reconstruction following left mastectomy. Patient has history prior dual plane breast augmentation.   FINDINGS: Natrelle 133MX-11-T 300 ml, initial fill volume 120 ml. SN [id number]:[id number]. McGhan [id number] ml silicone implant removed intact.  DESCRIPTION OF PROCEDURE:  The patient's operative site was marked with the patient in the preoperative area. The patient was taken to the operating room. SCDs were placed and IV antibiotics were given. The patient's operative site was prepped and draped in a sterile fashion. A time out was performed and all information was confirmed to be correct. Following completion of mastectomy, additional submuscular dissection completed. As noted, prior augmentation in dual plane position and implant removed had marking "McGhan 300." Of note patient's inframammary fold was lower on left preoperatively and she demonstrated lateral displacement of implant in supine position. Desired location inframammary fold symmetric with right marked. Cortiva acellular dermis prepared, perforated and inset to inferior border pectoralis major muscle with 3-0 vicryl It was inset to chest wall as desired inframammary ford. Laterally, the serratus fascia and muscle elevated. The cavity was then irrigated with solution containing polymyxin and bacitracin. Hemostasis obtained and  19 Fr drain placed, distal drain in submuscular position and proximal in subcutaneous position. The expander was prepared and placed in submuscular position. The caudal border of acellular dermis inset to chest wall inferiorly and to elevated serratus laterally with 3-0 vicryl. Skin closure completed with 3-0 vicryl in superficial fascia, 4-0 vicryl in dermis and 4-0 monocryl for skin closures. Tissue adhesive applied. Dry dressing and breast binder applied.   The patient was allowed to wake from anesthesia, extubated and taken to the recovery room in satisfactory condition.   SPECIMENS: none  DRAINS: 19 Fr JP in left chest  Irene Limbo, MD [facility] Plastic & Reconstructive Surgery [phone]

## 2015-03-09 NOTE — Anesthesia Procedure Notes (Addendum)
Anesthesia Regional Block:  Pectoralis block  Pre-Anesthetic Checklist: ,, timeout performed, Correct Patient, Correct Site, Correct Laterality, Correct Procedure, Correct Position, site marked, Risks and benefits discussed, pre-op evaluation, post-op pain management  Laterality: Left  Prep: Maximum Sterile Barrier Precautions used and chloraprep       Needles:  Injection technique: Single-shot  Needle Type: Echogenic Stimulator Needle     Needle Length: 10cm 10 cm Needle Gauge: 21 and 21 G    Additional Needles:  Procedures: ultrasound guided (picture in chart) Pectoralis block Narrative:  Start time: 03/09/2015 12:01 PM End time: 03/09/2015 12:11 PM Injection made incrementally with aspirations every 5 mL. Anesthesiologist: Roderic Palau  Additional Notes: 2% Lidocaine skin wheel.   Procedure Name: Intubation Date/Time: 03/09/2015 1:09 PM Performed by: Manus Gunning, Kamaya Keckler J Pre-anesthesia Checklist: Patient identified, Timeout performed, Emergency Drugs available, Suction available and Patient being monitored Patient Re-evaluated:Patient Re-evaluated prior to inductionOxygen Delivery Method: Circle system utilized Preoxygenation: Pre-oxygenation with 100% oxygen Intubation Type: IV induction Ventilation: Mask ventilation without difficulty Laryngoscope Size: Mac and 3 Grade View: Grade I Tube size: 7.0 mm Number of attempts: 1 Placement Confirmation: ETT inserted through vocal cords under direct vision,  breath sounds checked- equal and bilateral and positive ETCO2 Secured at: 21 cm Tube secured with: Tape Dental Injury: Teeth and Oropharynx as per pre-operative assessment

## 2015-03-09 NOTE — Op Note (Addendum)
03/09/2015  2:28 PM  PATIENT:  Joann Collins  50 y.o. female  PRE-OPERATIVE DIAGNOSIS:  LEFT BREAST CANCER  POST-OPERATIVE DIAGNOSIS:  Left Breast Cancer  PROCEDURE:  Procedure(s): LEFT MASTECTOMY WITH SENTINEL LYMPH NODE MAPPING (Left) LEFT BREAST RECONSTRUCTION WITH PLACEMENT OF TISSUE EXPANDER AND POSSIBLE ACELLULAR DERMIS (Left)  SURGEON:  Surgeon(s) and Role: Panel 1:    * Autumn Messing III, MD - Primary  Panel 2:    * Irene Limbo, MD - Primary  PHYSICIAN ASSISTANT:   ASSISTANTS: Dr. Leland Johns   ANESTHESIA:   general  EBL:  Total I/O In: 1000 [I.V.:1000] Out: 175 [Urine:175]  BLOOD ADMINISTERED:none  DRAINS: none   LOCAL MEDICATIONS USED:  NONE  SPECIMEN:  Source of Specimen:  left mastectomy with implant and sentinel nodes X 4  DISPOSITION OF SPECIMEN:  PATHOLOGY  COUNTS:  YES  TOURNIQUET:  * No tourniquets in log *  DICTATION: .Dragon Dictation   After informed consent was obtained the patient was brought to the operating room and placed in the supine position on the operating room table. After adequate induction of general anesthesia the patient's bilateral chest, breast, and axillary areas were prepped with ChloraPrep, allowed to dry, and draped in usual sterile manner. Earlier in day the patient underwent injection of 1 mCi of technetium sulfur colloid In the subareolar region of the left breast. At this point an elliptical incision was made around the nipple and areola complex with a 10 blade knife. The incision was carried through the skin and subcutaneous tissue sharply with the Plasma blade. The lateral breast skin overlying the cancer was incorporated in the incision. Skin Hooks were used to elevate the skin flaps anteriorly towards the ceiling. Thin skin flaps were created circumferentially Between the breast tissue and the subcutaneous fat. This dissection was carried all the way to the chest wall with the plasma blade. Next the breast was removed  from the pectoralis muscle with the pectoralis fascia. The capsule around the implant was removed with the specimen. Both these skin flaps and the muscle were very thin from the previous implant. Only a small amount of the capsule was unable to be removed because of its adherence to the ribs and pectoralis muscle. Once the breast was removed it was oriented with a stitch on the lateral skin. The neoprobe was sent to technetium and the axilla was examined at this point. There were several areas of increased radioactivity. Using the neoprobe to direct blunt hemostat dissection I was able to identify 4 lymph nodes with radioactivity. These were excised sharply with the plasma blade and the lymphatics were controlled with clips. The radioactivity that was measured and these nodes ranged from 100 to 400. The sentinel nodes numbered 1 through 4 were sent to pathology for further evaluation. At this point the pectoralis muscle was intact although it was then and the skin flaps appeared viable. The wound was examined and found to be hemostatic. She had tolerated this portion of the operation very well. At the end of this portion of the case all needle sponge and instrument counts were correct. At this point the case was turned over to Dr. Leland Johns for the reconstruction. Her portion of the case will be dictated separately.  PLAN OF CARE: Admit for overnight observation  PATIENT DISPOSITION:  PACU - hemodynamically stable.   Delay start of Pharmacological VTE agent (>24hrs) due to surgical blood loss or risk of bleeding: no

## 2015-03-09 NOTE — Interval H&P Note (Signed)
History and Physical Interval Note:  03/09/2015 12:26 PM  Joann Collins  has presented today for surgery, with the diagnosis of LEFT BREAST CANCER  The various methods of treatment have been discussed with the patient and family. After consideration of risks, benefits and other options for treatment, the patient has consented to  Procedure(s): LEFT MASTECTOMY WITH SENTINEL [place] (Left) with removal of previous implant and capsule LEFT BREAST RECONSTRUCTION WITH PLACEMENT OF TISSUE EXPANDER AND POSSIBLE ACELLULAR DERMIS (Left) as a surgical intervention .  The patient's history has been reviewed, patient examined, no change in status, stable for surgery.  I have reviewed the patient's chart and labs.  Questions were answered to the patient's satisfaction.     TOTH III,Ellena Kamen S

## 2015-03-09 NOTE — H&P (Signed)
Joann Collins Patient #: [id number] DOB: Feb 26, 1965 Married / Language: Cleophus Molt / Race: White Female   History of Present Illness  Patient words: left breast cancer.  The patient is a 50 year old female who presents with breast cancer. We are asked to see the patient in consultation by Dr. Curlene Dolphin to evaluate her for a new left breast cancer. The patient is a 50 year old white female who recently felt a mass in the outer aspect of the left breast about 2 months ago. She brought this to her doctor's attention and was evaluated with mammogram and ultrasound. The mammogram showed an area of distortion below and behind a cyst in the outer aspect of the left breast. 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 She was ER and PR positive and HER-2 negative with a Ki-67 of 3%. She does not take any female hormones. She does have a history of breast cancer in 2 maternal aunts. She does have retropectoral implants bilaterally.   Past Surgical History  Breast Augmentation Bilateral. Breast Biopsy Left. Cesarean Section - Multiple Hysterectomy (not due to cancer) - Partial  Diagnostic Studies History  Colonoscopy never Mammogram within last year Pap Smear 1-5 years ago  Allergies  Penicillin G Pot in Dextrose *PENICILLINS*  Medication History  No Current Medications Medications Reconciled  Social History  Alcohol use Occasional alcohol use. Caffeine use Coffee. No drug use Tobacco use Former smoker.  Family History  Breast Cancer Family Members In General. Heart disease in female family member before age 32  Pregnancy / Birth History Age at menarche 51 years. Gravida 2 Maternal age 54-30 Para 2    Review of Systems General Not Present- Appetite Loss, Chills, Fatigue, Fever, Night Sweats, Weight Gain and Weight Loss. Skin Not Present- Change in Wart/Mole, Dryness, Hives, Jaundice, New Lesions, Non-Healing Wounds, Rash and  Ulcer. HEENT Present- Wears glasses/contact lenses. Not Present- Earache, Hearing Loss, Hoarseness, Nose Bleed, Oral Ulcers, Ringing in the Ears, Seasonal Allergies, Sinus Pain, Sore Throat, Visual Disturbances and Yellow Eyes. Respiratory Not Present- Bloody sputum, Chronic Cough, Difficulty Breathing, Snoring and Wheezing. Breast Present- Breast Mass. Not Present- Breast Pain, Nipple Discharge and Skin Changes. Cardiovascular Not Present- Chest Pain, Difficulty Breathing Lying Down, Leg Cramps, Palpitations, Rapid Heart Rate, Shortness of Breath and Swelling of Extremities. Gastrointestinal Not Present- Abdominal Pain, Bloating, Bloody Stool, Change in Bowel Habits, Chronic diarrhea, Constipation, Difficulty Swallowing, Excessive gas, Gets full quickly at meals, Hemorrhoids, Indigestion, Nausea, Rectal Pain and Vomiting. Female Genitourinary Not Present- Frequency, Nocturia, Painful Urination, Pelvic Pain and Urgency. Musculoskeletal Not Present- Back Pain, Joint Pain, Joint Stiffness, Muscle Pain, Muscle Weakness and Swelling of Extremities. Neurological Not Present- Decreased Memory, Fainting, Headaches, Numbness, Seizures, Tingling, Tremor, Trouble walking and Weakness. Psychiatric Not Present- Anxiety, Bipolar, Change in Sleep Pattern, Depression, Fearful and Frequent crying. Endocrine Not Present- Cold Intolerance, Excessive Hunger, Hair Changes, Heat Intolerance, Hot flashes and New Diabetes. Hematology Not Present- Easy Bruising, Excessive bleeding, Gland problems, HIV and Persistent Infections.  Vitals  Weight: 118.25 lb Height: 61in Body Surface Area: 1.51 m Body Mass Index: 22.34 kg/m  BP: 112/70 (Sitting, Left Arm, Standard)       Physical Exam  General Mental Status-Alert. General Appearance-Consistent with stated age. Hydration-Well hydrated. Voice-Normal.  Head and Neck Head-normocephalic, atraumatic with no lesions or palpable  masses. Trachea-midline. Thyroid Gland Characteristics - normal size and consistency.  Eye Eyeball - Bilateral-Extraocular movements intact. Sclera/Conjunctiva - Bilateral-No scleral icterus.  Chest and Lung  Exam Chest and lung exam reveals -quiet, even and easy respiratory effort with no use of accessory muscles and on auscultation, normal breath sounds, no adventitious sounds and normal vocal resonance. Inspection Chest Wall - Normal. Back - normal.  Breast Note: There is a palpable 1cm mass in the lateral aspect of the left breast. There is no palpable mass in the right breast. There is no palpable axillary, supraclavicular, or cervical lymphadenopathy. She does have retropectoral implants   Cardiovascular Cardiovascular examination reveals -normal heart sounds, regular rate and rhythm with no murmurs and normal pedal pulses bilaterally.  Abdomen Inspection Inspection of the abdomen reveals - No Hernias. Skin - Scar - no surgical scars. Palpation/Percussion Palpation and Percussion of the abdomen reveal - Soft, Non Tender, No Rebound tenderness, No Rigidity (guarding) and No hepatosplenomegaly. Auscultation Auscultation of the abdomen reveals - Bowel sounds normal.  Neurologic Neurologic evaluation reveals -alert and oriented x 3 with no impairment of recent or remote memory. Mental Status-Normal.  Musculoskeletal Normal Exam - Left-Upper Extremity Strength Normal and Lower Extremity Strength Normal. Normal Exam - Right-Upper Extremity Strength Normal and Lower Extremity Strength Normal.  Lymphatic Head & Neck  General Head & Neck Lymphatics: Bilateral - Description - Normal. Axillary  General Axillary Region: Bilateral - Description - Normal. Tenderness - Non Tender. Femoral & Inguinal  Generalized Femoral & Inguinal Lymphatics: Bilateral - Description - Normal. Tenderness - Non Tender.    Assessment & Plan  BREAST CANCER OF UPPER-OUTER  QUADRANT OF LEFT FEMALE BREAST (C50.412) Impression: The patient appears to have a small stage I cancer in the upper outer quadrant of the left breast. Because it is ill-defined and because of her breast density I would recommend getting an MRI to further evaluate cancer. I have talked her in detail about the different options for treatment and at this point she would like to get the MRI before making her final choice between breast conservation and mastectomy. We have also talked about potential reconstruction and she does have retropectoral implants in already. We will call her with the results of the MRI and then proceed accordingly. Current Plans Referred to Oncology, for evaluation and follow up (Oncology). Routine. Pt Education - Breast Cancer: discussed with patient and provided information. The MRI showed the area to be larger than initially thought and the enhancement involved the capsule of her implant. She has elected for left mastectomy with sentinel node mapping and reconstruction  Signed by Luella Cook, MD

## 2015-03-09 NOTE — Interval H&P Note (Signed)
History and Physical Interval Note:  03/09/2015 7:29 AM  Joann Collins  has presented today for surgery, with the diagnosis of LEFT BREAST CANCER  The various methods of treatment have been discussed with the patient and family. After consideration of risks, benefits and other options for treatment, the patient has consented to  Procedure(s): LEFT MASTECTOMY WITH SENTINEL LYMPH NODE MAPPING (Left) LEFT BREAST RECONSTRUCTION WITH PLACEMENT OF TISSUE EXPANDER AND POSSIBLE ACELLULAR DERMIS (Left) as a surgical intervention .  The patient's history has been reviewed, patient examined, no change in status, stable for surgery.  I have reviewed the patient's chart and labs.  Questions were answered to the patient's satisfaction.     Al Gagen

## 2015-03-10 ENCOUNTER — Encounter (HOSPITAL_COMMUNITY): Payer: Self-pay | Admitting: General Surgery

## 2015-03-10 DIAGNOSIS — C50412 Malignant neoplasm of upper-outer quadrant of left female breast: Secondary | ICD-10-CM | POA: Diagnosis not present

## 2015-03-10 MED ORDER — OXYCODONE-ACETAMINOPHEN 5-325 MG PO TABS: 1.0000 | ORAL_TABLET | ORAL | Status: DC | PRN

## 2015-03-10 MED ORDER — METHOCARBAMOL 750 MG PO TABS: 750.0000 mg | ORAL_TABLET | Freq: Four times a day (QID) | ORAL | Status: DC | PRN

## 2015-03-10 NOTE — Progress Notes (Signed)
Patient's BP noted to be dropping, 2118 BP 93/86 PR 68, 0133 BP 84/50 PR 68, 0149 BP 78/49 PR 66 manual.  Pt. Is asymptomatic but was worried of dropping BP. On call CCS MD paged and awaiting for response.  Provided emotional support to patient and made aware that on call MD was paged.

## 2015-03-10 NOTE — Progress Notes (Signed)
Rechecked pt's BP 86/54 PR 70 manually sitting at right arm.  Patient denies dizziness and asymptomatic and made aware of manual BP result.  Will continue to monitor.

## 2015-03-10 NOTE — Discharge Planning (Signed)
Patient discharged home in stable condition. Verbalizes understanding of all discharge instructions, including home medications, JP drain care and follow up appointments.  ?

## 2015-03-10 NOTE — Discharge Summary (Signed)
Physician Discharge Summary  Patient ID: Joann Collins MRN: [id number] DOB/AGE: 48-Apr-1967 48 y.o.  Admit date: 03/09/2015 Discharge date: 03/10/2015  Admission Diagnoses: left breast cancer  Discharge Diagnoses:  Active Problems:   Breast cancer, female, left   Discharged Condition: stable  Hospital Course: Post operatively patient had hypotension that was asymptomatic and no intervention given. Maintained on IV antibiotics and transition to oral for home. Recommend oral motrin scheduled and prescription given.   Treatments: surgery: left mastectomy, sentinel node, placement tissue expander and acellular dermis for reconstruction  Discharge Exam: Blood pressure 97/58, pulse 63, temperature 98.4 F (36.9 C), temperature source Oral, resp. rate 16, height 5\' 1"  (1.549 m), weight 52.164 kg (115 lb), SpO2 100 %. Incision/Wound: flaps viable, incision dry  Disposition: home   Discharge Instructions    Call MD for:  difficulty breathing, headache or visual disturbances    Complete by:  As directed      Call MD for:  extreme fatigue    Complete by:  As directed      Call MD for:  hives    Complete by:  As directed      Call MD for:  persistant dizziness or light-headedness    Complete by:  As directed      Call MD for:  persistant nausea and vomiting    Complete by:  As directed      Call MD for:  redness, tenderness, or signs of infection (pain, swelling, bleeding, redness, odor or green/yellow discharge around incision site)    Complete by:  As directed      Call MD for:  redness, tenderness, or signs of infection (pain, swelling, redness, odor or green/yellow discharge around incision site)    Complete by:  As directed      Call MD for:  severe uncontrolled pain    Complete by:  As directed      Call MD for:  temperature >100.4    Complete by:  As directed      Call MD for:  temperature >100.5    Complete by:  As directed      Diet - low sodium heart healthy     Complete by:  As directed      Discharge instructions    Complete by:  As directed   Ok to remove dressings and shower am 03/11/15. Pat incisions dry. Soap and water ok. No creams or ointments over incisions.   Do not let drains dangle, secure to laynard or similar (eg, wash cloth around neck) while in shower.  Strip and record drains twice daily, bring log to clinic visit.  Breast binder or compression bra all other times.     Discharge instructions    Complete by:  As directed   Sponge bathe while drains are in. Empty drain, record output, recharge bulb twice a day. No overhead activity     Driving Restrictions    Complete by:  As directed   No driving while taking narcotics     Increase activity slowly    Complete by:  As directed      Lifting restrictions    Complete by:  As directed   No lifting greater than 5 lbs     No wound care    Complete by:  As directed      Resume previous diet    Complete by:  As directed             Medication List  TAKE these medications        acyclovir 800 MG tablet  Commonly known as:  ZOVIRAX  Take 800 mg by mouth daily.     methocarbamol 750 MG tablet  Commonly known as:  ROBAXIN  Take 1 tablet (750 mg total) by mouth every 6 (six) hours as needed (use for muscle cramps/pain).     oxyCODONE-acetaminophen 5-325 MG tablet  Commonly known as:  ROXICET  Take 1-2 tablets by mouth every 4 (four) hours as needed.     sulfamethoxazole-trimethoprim 800-160 MG tablet  Commonly known as:  BACTRIM DS,SEPTRA DS  Take 1 tablet by mouth 2 (two) times daily.           Follow-up Information    Follow up with Merrie Roof, MD In 2 weeks.   Specialty:  General Surgery   Contact information:   [address] [place] [address] [phone]       Follow up with [facility], Arnoldo Hooker, MD In 1 week.   Specialty:  Plastic Surgery   Why:  as scheduled   Contact information:   [place]  [address] [phone]       Follow up with Merrie Roof, MD In 2 weeks.   Specialty:  General Surgery   Contact information:   [address] [place]  [address] [phone]       Signed: Irene Limbo 03/10/2015, 9:29 AM

## 2015-03-10 NOTE — Progress Notes (Signed)
1 Day Post-Op  Subjective: Complains of muscle spasms  Objective: Vital signs in last 24 hours: Temp:  [97.9 F (36.6 C)-98.5 F (36.9 C)] 98.4 F (36.9 C) (02/28 0506) Pulse Rate:  [59-81] 63 (02/28 0506) Resp:  [10-23] 16 (02/28 0506) BP: (78-113)/(49-82) 97/58 mmHg (02/28 0506) SpO2:  [99 %-100 %] 100 % (02/28 0506) Weight:  [52.164 kg (115 lb)] 52.164 kg (115 lb) (02/27 1712) Last BM Date: 03/08/15  Intake/Output from previous day: 02/27 0701 - 02/28 0700 In: 3107.9 [P.O.:720; I.V.:2137.9; IV Piggyback:250] Out: 385 [Urine:225; Drains:110; Blood:50] Intake/Output this shift:    Resp: clear to auscultation bilaterally Chest wall: skin flaps look good Cardio: regular rate and rhythm GI: soft, non-tender; bowel sounds normal; no masses,  no organomegaly  Lab Results:  No results for input(s): WBC, HGB, HCT, PLT in the last 72 hours. BMET No results for input(s): NA, K, CL, CO2, GLUCOSE, BUN, CREATININE, CALCIUM in the last 72 hours. PT/INR No results for input(s): LABPROT, INR in the last 72 hours. ABG No results for input(s): PHART, HCO3 in the last 72 hours.  Invalid input(s): PCO2, PO2  Studies/Results: Nm Sentinel Node Inj-no Rpt (breast)  03/09/2015  CLINICAL DATA: left breast cancer Sulfur colloid was injected intradermally by the nuclear medicine technologist for breast cancer sentinel node localization.    Anti-infectives: Anti-infectives    Start     Dose/Rate Route Frequency Ordered Stop   03/09/15 2200  Clindamycin 600 mg in dextrose 5% 50 ml (not a premix)      100 mL/hr  Intravenous Every 8 hours 03/09/15 2055     03/09/15 1730  clindamycin (CLEOCIN) IVPB 600 mg  Status:  Discontinued     600 mg 100 mL/hr over 30 Minutes Intravenous 3 times per day 03/09/15 1719 03/09/15 2254   03/09/15 1235  polymyxin B 500,000 Units, bacitracin 50,000 Units in sodium chloride irrigation 0.9 % 500 mL irrigation  Status:  Discontinued       As needed 03/09/15 1356  03/09/15 1533   03/09/15 0600  vancomycin (VANCOCIN) IVPB 1000 mg/200 mL premix  Status:  Discontinued     1,000 mg 200 mL/hr over 60 Minutes Intravenous On call to O.R. 03/08/15 1459 03/09/15 1712   03/09/15 0000  sulfamethoxazole-trimethoprim (BACTRIM DS,SEPTRA DS) 800-160 MG tablet     1 tablet Oral 2 times daily 03/09/15 1523        Assessment/Plan: s/p Procedure(s): LEFT MASTECTOMY WITH SENTINEL LYMPH NODE MAPPING (Left) LEFT BREAST RECONSTRUCTION WITH PLACEMENT OF TISSUE EXPANDER AND POSSIBLE ACELLULAR DERMIS (Left) Advance diet Discharge  Teach pt drain care     TOTH III,Kaitlynn Tramontana S 03/10/2015

## 2015-03-11 ENCOUNTER — Encounter (HOSPITAL_COMMUNITY): Payer: Self-pay | Admitting: General Surgery

## 2015-03-13 ENCOUNTER — Encounter: Payer: Self-pay | Admitting: *Deleted

## 2015-03-13 NOTE — Progress Notes (Signed)
Ordered oncotype per Dr. Burr Medico.  Requisition faxed to pathology and confirmed receipt. Faxed PA to [facility].

## 2015-03-20 ENCOUNTER — Encounter (HOSPITAL_COMMUNITY): Payer: Self-pay | Admitting: General Surgery

## 2015-03-23 ENCOUNTER — Encounter (HOSPITAL_COMMUNITY): Payer: Self-pay

## 2015-03-23 ENCOUNTER — Telehealth: Payer: Self-pay | Admitting: *Deleted

## 2015-03-23 NOTE — Telephone Encounter (Signed)
Received Oncotype Dx results of 9 / 6%.  Sent a copy to HIM to scan.

## 2015-03-30 ENCOUNTER — Ambulatory Visit (HOSPITAL_BASED_OUTPATIENT_CLINIC_OR_DEPARTMENT_OTHER): Payer: Federal, State, Local not specified - PPO | Admitting: Hematology

## 2015-03-30 ENCOUNTER — Ambulatory Visit: Payer: Federal, State, Local not specified - PPO

## 2015-03-30 ENCOUNTER — Encounter: Payer: Self-pay | Admitting: Hematology

## 2015-03-30 ENCOUNTER — Telehealth: Payer: Self-pay | Admitting: Hematology

## 2015-03-30 VITALS — BP 107/60 | HR 71 | Temp 98.2°F | Resp 18 | Ht 61.0 in | Wt 119.7 lb

## 2015-03-30 DIAGNOSIS — Z170 Estrogen receptor positive status [ER+]: Secondary | ICD-10-CM | POA: Diagnosis not present

## 2015-03-30 DIAGNOSIS — C50512 Malignant neoplasm of lower-outer quadrant of left female breast: Secondary | ICD-10-CM

## 2015-03-30 MED ORDER — TAMOXIFEN CITRATE 20 MG PO TABS: 20.0000 mg | ORAL_TABLET | Freq: Every day | ORAL | Status: DC

## 2015-03-30 NOTE — Telephone Encounter (Signed)
per pof to sch pt appt-mailed avs

## 2015-03-30 NOTE — Progress Notes (Signed)
[facility]  Telephone:([phone] Fax:([phone]) [phone]  Clinic Follow Up Note   Patient Care Team: Curly Rim, MD as PCP - General (Family Medicine) Truitt Merle, MD as Consulting Physician (Hematology) Autumn Messing III, MD as Consulting Physician (General Surgery) 03/30/2015   CHIEF COMPLAINTS:  Follow Up left breast cancer   Oncology History   Breast cancer of lower-outer quadrant of left female breast [facility])   Staging form: Breast, AJCC 7th Edition     Clinical: Stage IIB (T3, N0, M0) - Unsigned     Pathologic stage from 03/09/2015: Stage IIA (T2, N0, cM0) - Signed by Truitt Merle, MD on 03/30/2015       Breast cancer of lower-outer quadrant of left female breast ([place])   01/27/2015 Mammogram diagnostic mammogram and ultrasound showed architectural distortion within the left outer breast, 3:00 position.   01/29/2015 Receptors her2 ER 95% positive, PR 85% positive, HER-2 negative,Ki-67 5%.   01/29/2015 Initial Biopsy left breast mass biopsy showed low-grade invasive ductal carcinoma.   02/06/2015 Imaging bilateral breast MRI with and without contrast showed no mass like enhancement measuring 1.4 x 6.3 x 3.9 cm at the left breast 3 to 5:00 position. No adenopathy.   02/12/2015 Initial Diagnosis Breast cancer of lower-outer quadrant of left female breast ([place])   03/09/2015 Surgery left breast simple mastectomy, with tissue expander placement, and sentinel lymph node biopsy.   03/09/2015 Pathology Results left breast mastectomy showed grade 1 invasive ductal carcinoma, 4 cm, 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   03/09/2015 Oncotype testing RS 9, which predicts a distant recurrence risk of 6% with tamoxifen alone    HISTORY OF PRESENTING ILLNESS (02/12/2015):  Joann Collins 50 y.o. female is here because of her newly diagnosed left breast cancer.   She felt a breast lump  2 month ago, no pain, skin change or discharge, nipple change. She otherwise feels well, denies any other symptoms. Her last screening mammogram was normal in April 2014. She moved from [place] to [facility] about 6 months ago. Due to the breast lump, she went to see a gynecologist, and a mammogram was done on 01/27/2015. The mammogram and ultrasound showed a 1.1 cm cyst at 2:00 position which corresponding to the palpable mass, and an area of architectural distortion at the 2 to 3:00 position of the left breast. She underwent ultrasound-guided core needle biopsy of the left breast lesion at 3 clock position. She developed some pain at the biopsy site, and mild skin erythema, mild numbness of the left upper arm.   She was referred to see her breast surgeon Dr. Marlou Starks. Dr. Marlou Starks ordered a breast MRI and discussed the MRI findings with patient. Mastectomy was recommended which is also favored by patient.  CURRENT THERAPY: pending tamoxifen  INTERIM HISTORY: [name] returns for follow-up. She underwent left mastectomy with tissue expander placement, and sentinel lymph node biopsy on 03/09/2015. She tolerated surgery well, has mild residual pain at the incision site. She is recovering well overall. She does have limited range of motion of left shoulder, and will start physical therapy later this week. No other new complaints.   MEDICAL HISTORY:  Past Medical History  Diagnosis Date  . Spinal headache     with second c-section  . Cancer [facility])     left breast cancer    SURGICAL HISTORY: Past Surgical History  Procedure Laterality Date  .  Abdominal hysterectomy    . Cesarean section      x 2  . Breast enhancement surgery      2000  . Breast surgery      biopsy  . Tonsillectomy    . Wisdom tooth extraction    . Mastectomy w/ sentinel node biopsy Left 03/09/2015    Procedure: LEFT MASTECTOMY WITH SENTINEL LYMPH NODE MAPPING;  Surgeon: Autumn Messing III, MD;  Location: [place];   Service: General;  Laterality: Left;  . Breast reconstruction with placement of tissue expander and flex hd (acellular hydrated dermis) Left 03/09/2015    Procedure: LEFT BREAST RECONSTRUCTION WITH PLACEMENT OF TISSUE EXPANDER AND POSSIBLE ACELLULAR DERMIS;  Surgeon: Irene Limbo, MD;  Location: [place];  Service: Plastics;  Laterality: Left;    GYN HISTORY  Menarchal: 14 LMP: 2000 before she had hysterectomy Contraceptive: 4 years  HRT: none  G2P2:   SOCIAL HISTORY: Social History   Social History  . Marital Status: Married    Spouse Name: N/A  . Number of Children:  she has a son and a daughter who is 89   . Years of Education: N/A   Occupational History  . Not on file.   Social History Main Topics  . Smoking status: Former Smoker -- 1.00 packs/day for 30 years  . Smokeless tobacco: Not on file  . Alcohol Use: Yes     Comment: socail   . Drug Use: No  . Sexual Activity: Not on file   Other Topics Concern  . Not on file   Social History Narrative   She works for [employer], she is a [employer] manager, she works from home   FAMILY HISTORY: Family History  Problem Relation Age of Onset  . Breast cancer  60  . Breast cancer Maternal Aunt 105  . Breast cancer Maternal Aunt 83  . Heart disease Other     ALLERGIES:  is allergic to penicillins.  MEDICATIONS:  Current Outpatient Prescriptions  Medication Sig Dispense Refill  . acyclovir (ZOVIRAX) 800 MG tablet Take 800 mg by mouth daily.    Marland Kitchen ibuprofen (ADVIL,MOTRIN) 800 MG tablet Take 800 mg by mouth daily.    . methocarbamol (ROBAXIN) 750 MG tablet Take 1 tablet (750 mg total) by mouth every 6 (six) hours as needed (use for muscle cramps/pain). 30 tablet 2  . oxyCODONE-acetaminophen (ROXICET) 5-325 MG tablet Take 1-2 tablets by mouth every 4 (four) hours as needed. 50 tablet 0  . tamoxifen (NOLVADEX) 20 MG tablet Take 1 tablet (20 mg total) by mouth daily. 30 tablet 2   No current facility-administered  medications for this visit.    REVIEW OF SYSTEMS:   Constitutional: Denies fevers, chills or abnormal night sweats Eyes: Denies blurriness of vision, double vision or watery eyes Ears, nose, mouth, throat, and face: Denies mucositis or sore throat Respiratory: Denies cough, dyspnea or wheezes Cardiovascular: Denies palpitation, chest discomfort or lower extremity swelling Gastrointestinal:  Denies nausea, heartburn or change in bowel habits Skin: Denies abnormal skin rashes Lymphatics: Denies new lymphadenopathy or easy bruising Neurological:Denies numbness, tingling or new weaknesses Behavioral/Psych: Mood is stable, no new changes  All other systems were reviewed with the patient and are negative.  PHYSICAL EXAMINATION: ECOG PERFORMANCE STATUS: 0 - Asymptomatic  Filed Vitals:   03/30/15 1356  BP: 107/60  Pulse: 71  Temp: 98.2 F (36.8 C)  Resp: 18   Filed Weights   03/30/15 1356  Weight: 119 lb 11.2 oz (54.296 kg)  GENERAL:alert, no distress and comfortable SKIN: skin color, texture, turgor are normal, no rashes or significant lesions EYES: normal, conjunctiva are pink and non-injected, sclera clear OROPHARYNX:no exudate, no erythema and lips, buccal mucosa, and tongue normal  NECK: supple, thyroid normal size, non-tender, without nodularity LYMPH:  no palpable lymphadenopathy in the cervical, axillary or inguinal LUNGS: clear to auscultation and percussion with normal breathing effort HEART: regular rate & rhythm and no murmurs and no lower extremity edema ABDOMEN:abdomen soft, non-tender and normal bowel sounds Musculoskeletal:no cyanosis of digits and no clubbing  PSYCH: alert & oriented x 3 with fluent speech NEURO: no focal motor/sensory deficits Breasts: Breast inspection showed them to be symmetrical with no nipple discharge. (+) A small area of skin erythema and healing ulcer at the biopsy site. There is a small palpable lump at the 2-3 clock position of left  breast, Palpation of the right breast and axilla revealed no obvious mass that I could appreciate.  LABORATORY DATA:  I have reviewed the data as listed No results found for: WBC, HGB, HCT, MCV, PLT No results for input(s): NA, K, CL, CO2, GLUCOSE, BUN, CREATININE, CALCIUM, GFRNONAA, GFRAA, PROT, ALBUMIN, AST, ALT, ALKPHOS, BILITOT, BILIDIR, IBILI in the last 8760 hours.  PATHOLOGY REPORT Diagnosis 03/09/2015 1. Breast, simple mastectomy, Left INVASIVE DUCTAL CARCINOMA, GRADE 1 EXTENSIVE DUCTAL CARCINOMA IN SITU WITH ASSOCIATED CALCIFICATIONS, GRADE 2 CYSTIC INTRADUCTAL PAPILLOMA MARGINS OF RESECTION ARE NEGATIVE FOR TUMOR 2. Lymph node, sentinel, biopsy, Left ONE BENIGN LYMPH NODE (0/1) 3. Lymph node, sentinel, biopsy, Left ONE BENIGN LYMPH NODE (0/1) 4. Lymph node, sentinel, biopsy, Left ONE BENIGN LYMPH NODE (0/1) 5. Lymph node, sentinel, biopsy, Left ONE BENIGN LYMPH NODE (0/1) Microscopic Comment 1. BREAST, INVASIVE TUMOR, WITH LYMPH NODES PRESENT Specimen, including laterality and lymph node sampling (sentinel, non-sentinel): Left Breast and sentinel lymph nodes Procedure: Simple mastectomy Histologic type: Grade: 1 Tubule formation: 1 Nuclear pleomorphism: 2 Mitotic:1 Tumor size (gross measurement or glass slide measurement): 4 cm Margins: Invasive, distance to closest margin: ) 0.5 cm In-situ, distance to closest margin: 0.3 cm to deep margin If margin positive, focally or broadly: NA Lymphovascular invasion: Negative Ductal carcinoma in situ: Present Grade: 2 Extensive intraductal component: Predominant Lobular neoplasia: Negative Tumor focality: Focal Treatment effect: Negative If present, treatment effect in breast tissue, lymph nodes or both: NA Extent of tumor: Skin: Negative Nipple: Negative Skeletal muscle: Negative Lymph nodes: Examined: 4 Sentinel 0 Non-sentinel 4 Total Lymph nodes with metastasis: Negative Isolated tumor cells (< 0.2 mm):  NA Micrometastasis: (> 0.2 mm and < 2.0 mm): NA Macrometastasis: (> 2.0 mm): NA Extracapsular extension: NA Breast prognostic profile: Estrogen receptor: 95% Progesterone receptor: 80% Her 2 neu: Negative and repeat Ki-67: 3% Non-neoplastic breast  ONCOTYPE DX: RS 9, which predicts a distant recurrence risk of 6% with tamoxifen alone  RADIOGRAPHIC STUDIES: I have personally reviewed the radiological images as listed and agreed with the findings in the report.  Mr Breast Bilateral W Wo Contrast 02/06/2015   FINDINGS: Breast composition: c. Heterogeneous fibroglandular tissue. Background parenchymal enhancement: Moderate. Right breast: No mass or abnormal enhancement. Left breast: There is diffuse non mass like enhancement with plateau enhancement kinetics involving the left breast 3 to 5 o'clock position measuring 1.4 x 6.3 x 3.9 cm with associated clip consistent with patient's known cancer. The enhancement extends to the skin laterally with mild enhancement of adjacent skin, extension extends to the skin laterally with mild enhancement of the adjacent skin, extension of tumor to  the skin is not excluded. Lymph nodes: No abnormal appearing lymph nodes. Ancillary findings:  None.  Bilateral implants are noted. IMPRESSION: Diffuse non masslike enhancement measuring 1.4 x 6.3 x 3.9 cm at the left breast 3 to 5 o'clock position correlating to the patient's known cancer. The enhancement extends to the skin laterally with mild enhancement of adjacent skin, tumor extension in the skin is not excluded. RECOMMENDATION: Treatment plan. BI-RADS CATEGORY  6: Known biopsy-proven malignancy. Electronically Signed   By: Abelardo Diesel M.D.   On: 02/06/2015 15:15   US Breast Ltd Uni Left Inc Axilla 01/27/2015   IMPRESSION: 1. Architectural distortion within the outer LEFT breast, 3 o'clock axis region, at middle depth, slightly inferior to the site of a palpable left breast cyst. This distortion is a suspicious  finding for which stereotactic-guided biopsy is recommended. 2. Benign left breast cyst at the 2 o'clock axis, 2 cm from the nipple, corresponding to the palpable lump. 3. Additional benign cysts incidentally noted within the right breast. RECOMMENDATION: Stereotactic-guided biopsy, with tomosynthesis guidance, for the architectural distortion in the outer LEFT breast. Stereotactic-guided biopsy is scheduled for January 19th at 9 a.m. I have discussed the findings and recommendations with the patient. Results were also provided in writing at the conclusion of the visit. If applicable, a reminder letter will be sent to the patient regarding the next appointment. BI-RADS CATEGORY  4: Suspicious. Electronically Signed   By: Franki Cabot M.D.   On: 01/27/2015 17:04    ASSESSMENT & PLAN:  50 year old Caucasian female, presented with a palpable left breast lump.   1. 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 -she has stage II disease, tumor measuring 4 cm, low-grade, all 4 sentinel lymph nodes were negative. -I discussed her Oncotype DX genomic testing result. Her tumor has low recurrence score 9, which predicts 6% of 10 year distant recurrence with tamoxifen alone. Given the low risk recurrence score, there is no benefit of adjuvant chemotherapy and I would not recommend it. -she had a computed surgical resection, likely will do very well, given the low risk of cancer recurrence. --Giving her strongly ER and PR positivity of the tumor cells, and her likely pre-menopause status based on her age and no history of menopause symptoms, I recommend adjuvant endocrine therapy with tamoxifen. The potential side effects, which includes but not limited to, hot flash, skin and vaginal dryness, slightly increased risk of cardiovascular disease  and cataract, small risk of thrombosis and endometrial cancer, were discussed with her in great details. Preventive strategies for thrombosis, such as being physically active, using compression stocks, avoid cigarette smoking, etc., were reviewed with her. I also recommend her to follow-up with her gynecologist once a year, and watch for vaginal spotting or bleeding, as a clinically sign of endometrial cancer, etc. She voiced good understanding, and agrees to proceed. Will start after she completes adjuvant breast radiation. -Alternative adjuvant endocrine therapy, such as aromatase inhibitor and overexpression were discussed with her also, given her young age. Due to her very early stage breast cancer, a do not strongly feel this is necessary. -We also reviewed the breast cancer surveillance, including annual mammogram, and physical exam.  -we also discussed the role of chest wall radiation after mastectomy. Given her tumor size, and low-grade, the benefit probably is limited. Patient has decided not to pursue  radiation anyway. She declined radiation oncology consult. -I'll start her adjuvant tamoxifen in 2 weeks when she recovers better from surgery.   2. Genetics -She has 3 second generation relatives with history of breast cancer, one was 72 at his diagnosis. I recommend her to have genetic testing to ruled out inheritable breast and ovary syndrome. -She is very interested, but did not want genetic testing before surgery. -I'll refer her to genetic counseling today   Plan -start tamoxifen 20 mV daily in 2 weeks -I'll see her back in 6 weeks -She will see survivorship clinic 3 months -Genetic referred today  All questions were answered. The patient knows to call the clinic with any problems, questions or concerns. I spent 35 minutes counseling the patient face to face. The total time spent in the appointment was 40 minutes and more than 50% was on counseling.     Truitt Merle, MD 03/30/2015  4:59 PM

## 2015-03-31 ENCOUNTER — Ambulatory Visit: Payer: Federal, State, Local not specified - PPO | Admitting: Physical Therapy

## 2015-03-31 ENCOUNTER — Telehealth: Payer: Self-pay | Admitting: Hematology

## 2015-03-31 NOTE — Telephone Encounter (Signed)
per pof to sch pt appt-cld *spoke topt nad gave pt time & date of appt

## 2015-04-06 ENCOUNTER — Ambulatory Visit: Payer: Federal, State, Local not specified - PPO | Attending: Plastic Surgery | Admitting: Physical Therapy

## 2015-04-06 ENCOUNTER — Telehealth: Payer: Self-pay | Admitting: *Deleted

## 2015-04-06 ENCOUNTER — Other Ambulatory Visit (HOSPITAL_BASED_OUTPATIENT_CLINIC_OR_DEPARTMENT_OTHER): Payer: Federal, State, Local not specified - PPO

## 2015-04-06 DIAGNOSIS — M25611 Stiffness of right shoulder, not elsewhere classified: Secondary | ICD-10-CM | POA: Insufficient documentation

## 2015-04-06 DIAGNOSIS — C50512 Malignant neoplasm of lower-outer quadrant of left female breast: Secondary | ICD-10-CM | POA: Diagnosis not present

## 2015-04-06 LAB — CBC WITH DIFFERENTIAL/PLATELET
BASO%: 1.2 % (ref 0.0–2.0)
Basophils Absolute: 0.1 10*3/uL (ref 0.0–0.1)
EOS%: 2.2 % (ref 0.0–7.0)
Eosinophils Absolute: 0.2 10*3/uL (ref 0.0–0.5)
HEMATOCRIT: 37.4 % (ref 34.8–46.6)
HGB: 12.4 g/dL (ref 11.6–15.9)
LYMPH#: 2 10*3/uL (ref 0.9–3.3)
LYMPH%: 24.8 % (ref 14.0–49.7)
MCH: 28.8 pg (ref 25.1–34.0)
MCHC: 33.3 g/dL (ref 31.5–36.0)
MCV: 86.4 fL (ref 79.5–101.0)
MONO#: 0.7 10*3/uL (ref 0.1–0.9)
MONO%: 8.9 % (ref 0.0–14.0)
NEUT%: 62.9 % (ref 38.4–76.8)
NEUTROS ABS: 5 10*3/uL (ref 1.5–6.5)
PLATELETS: 241 10*3/uL (ref 145–400)
RBC: 4.32 10*6/uL (ref 3.70–5.45)
RDW: 13.1 % (ref 11.2–14.5)
WBC: 8 10*3/uL (ref 3.9–10.3)

## 2015-04-06 LAB — COMPREHENSIVE METABOLIC PANEL
ALT: 10 U/L (ref 0–55)
ANION GAP: 6 meq/L (ref 3–11)
AST: 13 U/L (ref 5–34)
Albumin: 4.2 g/dL (ref 3.5–5.0)
Alkaline Phosphatase: 38 U/L — ABNORMAL LOW (ref 40–150)
BILIRUBIN TOTAL: 0.35 mg/dL (ref 0.20–1.20)
BUN: 10.4 mg/dL (ref 7.0–26.0)
CALCIUM: 9.7 mg/dL (ref 8.4–10.4)
CHLORIDE: 111 meq/L — AB (ref 98–109)
CO2: 24 meq/L (ref 22–29)
CREATININE: 0.8 mg/dL (ref 0.6–1.1)
EGFR: 89 mL/min/{1.73_m2} — ABNORMAL LOW (ref >90)
Glucose: 89 mg/dl (ref 70–140)
Potassium: 5.4 mEq/L — ABNORMAL HIGH (ref 3.5–5.1)
Sodium: 140 mEq/L (ref 136–145)
TOTAL PROTEIN: 7.4 g/dL (ref 6.4–8.3)

## 2015-04-06 NOTE — Therapy (Addendum)
[place], [place], [address] Phone: [phone]   Fax:  [phone]  Physical Therapy Evaluation  Patient Details  Name: Joann Collins MRN: [id number] Date of Birth: 1965-11-20 Referring Provider: Dr. Irene Limbo  Encounter Date: 04/06/2015      PT End of Session - 04/06/15 1303    Visit Number 1   Number of Visits 1   PT Start Time 0933   PT Stop Time 1020   PT Time Calculation (min) 47 min   Activity Tolerance Patient tolerated treatment well   Behavior During Therapy [facility] for tasks assessed/performed      Past Medical History  Diagnosis Date  . Spinal headache     with second c-section  . Cancer [facility])     left breast cancer    Past Surgical History  Procedure Laterality Date  . Abdominal hysterectomy    . Cesarean section      x 2  . Breast enhancement surgery      2000  . Breast surgery      biopsy  . Tonsillectomy    . Wisdom tooth extraction    . Mastectomy w/ sentinel node biopsy Left 03/09/2015    Procedure: LEFT MASTECTOMY WITH SENTINEL LYMPH NODE MAPPING;  Surgeon: Autumn Messing III, MD;  Location: [place];  Service: General;  Laterality: Left;  . Breast reconstruction with placement of tissue expander and flex hd (acellular hydrated dermis) Left 03/09/2015    Procedure: LEFT BREAST RECONSTRUCTION WITH PLACEMENT OF TISSUE EXPANDER AND POSSIBLE ACELLULAR DERMIS;  Surgeon: Irene Limbo, MD;  Location: [place];  Service: Plastics;  Laterality: Left;    There were no vitals filed for this visit.  Visit Diagnosis:  Stiffness of right shoulder, not elsewhere classified - Plan: PT plan of care cert/re-cert      Subjective Assessment - 04/06/15 0938    Subjective Not sure if I need to be here.  Got full motion back in my arm, but then she filled me.  It feels like the expander is right on my ribs.   Pertinent History Diagnosed with left breast cancer 01/29/15.  Left mastectomy  03/09/15 with immediate expanders placed; has had two fills.  4 lymph nodes removed; all negative.  No chemo nor radiation planned; will be on tamoxifen.  Next surgery will be in about 3 months.  Otherwise healthy.   Patient Stated Goals Figure out if she needs therapy.   Currently in Pain? Yes   Pain Score 1   to 5   Pain Location Chest   Pain Orientation Left   Pain Descriptors / Indicators Sore   Aggravating Factors  lifting >5 lbs.   Pain Relieving Factors not weight using chest muscle   Effect of Pain on Daily Activities can't lie flat; sleeps sitting up and tends to lean right            [facility] PT Assessment - 04/07/15 0001    Observation/Other Assessments   Quick DASH  41           LYMPHEDEMA/ONCOLOGY QUESTIONNAIRE - 04/06/15 1000    Type   Cancer Type left breast   Surgeries   Mastectomy Date 03/09/15   Number Lymph Nodes Removed 4   Treatment   Past Chemotherapy Treatment No   Past Radiation Treatment No   Lymphedema Assessments   Lymphedema Assessments Upper extremities   Right Upper Extremity Lymphedema   10 cm Proximal to Olecranon Process 24.8  cm   Olecranon Process 22 cm   10 cm Proximal to Ulnar Styloid Process 21.6 cm   Just Proximal to Ulnar Styloid Process 15.1 cm   Across Hand at [employer] 19.1 cm   At [place] of 2nd Digit 6 cm   Left Upper Extremity Lymphedema   10 cm Proximal to Olecranon Process 24 cm   Olecranon Process 21.5 cm   10 cm Proximal to Ulnar Styloid Process 20.8 cm   Just Proximal to Ulnar Styloid Process 14.6 cm   Across Hand at [employer] 18 cm   At [place] of 2nd Digit 5.9 cm           Quick Dash - 04/07/15 0001    Open a tight or new jar No difficulty   Do heavy household chores (wash walls, wash floors) Moderate difficulty   Carry a shopping bag or briefcase Moderate difficulty   Wash your back Moderate difficulty   Use a knife to cut food No difficulty   Recreational activities in which you take some force or  impact through your arm, shoulder, or hand (golf, hammering, tennis) Unable   During the past week, to what extent has your arm, shoulder or hand problem interfered with your normal social activities with family, friends, neighbors, or groups? Quite a bit   During the past week, to what extent has your arm, shoulder or hand problem limited your work or other regular daily activities Modererately   Arm, shoulder, or hand pain. Moderate   Tingling (pins and needles) in your arm, shoulder, or hand None   Difficulty Sleeping Mild difficulty   DASH Score 40.91 %                     PT Education - 04/06/15 1252    Education provided Yes   Education Details dowel exercises for shoulder flexion and abduction; door jamb stretch for left shoulder ER (verbal instruction only); verbal instruction only about BMDC shoulder ROM exercises; about ABC class; about slight lymphedema risk   Person(s) Educated Patient   Methods Explanation;Demonstration;Handout  ABC class handout, handout about ABC class, and BMDC exercise sheet   Comprehension Verbalized understanding;Returned demonstration                [facility] Goals - 04/06/15 2029    CC Long Term Goal  #1   Title Patient will be independent with initial HEP for shoulder ROM.   Status Achieved   CC Long Term Goal  #2   Title Patient will be knowledgeable about lymphedema risk reduction.   Baseline Handout given today including information on this.   Status Partially Met            Plan - 04/06/15 1304    Clinical Impression Statement Patient is overall doing well since mastectomy, though she reports that her left shoulder ROM had been better but then decreased some and got uncomfortable after her last expander fill.  Her left shoulder AROM is limited in flexion, abduction, and external rotation, though not bad for this stage after surgery.  She is highly motivated and will likely be able to continue to progress on her  own with this by doing stretching at home, and that is her preference.  She does have cording in the left axilla and down the arm, and this was explained to her today.  She was shown stretches to work on at home and was given handouts; her low  lymphedema risk was discussed briefly today and she was told about our free ABC class.  Currently the plan is for her to work on her own and then call in a couple of weeks to report on her progress and whether she thinks she needs therapy to help with her deficits.   Pt will benefit from skilled therapeutic intervention in order to improve on the following deficits Decreased range of motion;Impaired UE functional use;Decreased knowledge of precautions   Rehab Potential Excellent   PT Frequency One time visit  more if needed; see plan   PT Treatment/Interventions ADLs/Self Care Home Management;DME Instruction;Therapeutic exercise;Patient/family education;Manual techniques;Passive range of motion   PT Next Visit Plan Patient was instructed in basic shoulder stretches today as well as about cording in her axilla; low lymphedema risk was also mentioned.  She wants to try to work on her own to see if she can regain full ROM by doing home exercises.  She plans to call me in a couple of weeks to check in.  if she is doing well, we will discharge her; if not, we will schedule follow-up appointments for furhter treatment as delineated above.   PT Home Exercise Plan dowel exercises for shoulder flexion and abduction; door jamb shoulder external rotation stretches   Recommended Other Services ABC class   Consulted and Agree with Plan of Care Patient         Problem List Patient Active Problem List   Diagnosis Date Noted  . Breast cancer, female, left 03/09/2015  . Breast cancer of lower-outer quadrant of left female breast ([place]) 02/12/2015    SALISBURY,DONNA 04/07/2015, 2:08 PM  [place], [place], [address] Phone: [phone]   Fax:  [phone]  Name: MALITA IGNASIAK MRN: [id number] Date of Birth: 11/24/1965   Serafina Royals, PT 04/07/2015 2:08 PM  PHYSICAL THERAPY DISCHARGE SUMMARY  Visits from Start of Care: 1  Current functional level related to goals / functional outcomes: Goals were at least partially met as noted above.   Remaining deficits: Unknown:  Patient was to call a couple weeks after she was seen for evaluation to report on her status.  I did not hear from her.     Education / Equipment: Home exercise program. Plan: Patient agrees to discharge.  Patient goals were partially met. Patient is being discharged due to not returning since the last visit.  ?????    Serafina Royals, PT 12/08/15 12:30 PM

## 2015-04-06 NOTE — Telephone Encounter (Signed)
Spoke with pt and informed pt re:  CMET results normal except slightly elevated K level.  Informed pt to avoid foods high in potassium.   Pt to have lab rechecked on 5/1 with next office visit. Confirmed with Dr. Burr Medico that pt to start Tamoxifen on  04/13/15.   Informed pt of starting date of Tamoxifen.  Pt voiced understanding.

## 2015-04-10 ENCOUNTER — Telehealth: Payer: Self-pay | Admitting: *Deleted

## 2015-04-10 NOTE — Telephone Encounter (Signed)
Called pt back after speaking to Dr Burr Medico & she thought it may be something viral & to just hold tamoxifen until this is resolved.  Pt reports that this has happened 4 times in last month since surgery.  Suggested she may want to talk with her PCP & see if they think her thyroid needs to be checked.

## 2015-04-10 NOTE — Telephone Encounter (Signed)
Received call from pt stating that sh has had something weird going on & reports that she has had episodes of feeling freezing cold & when she takes her temp, it is 96.5-97.1 & last hs she was freezing & sweating at the same time.  She states she has not started her tamoxifen yet but noted that her K+ had been up & wondered if this would cause this or if we had any thoughts on this.  Note to Dr Burr Medico.

## 2015-04-22 ENCOUNTER — Telehealth: Payer: Self-pay

## 2015-04-22 NOTE — Telephone Encounter (Signed)
Patient called today to inform Dr. Burr Medico that she still has not started the tamoxifen because her temperature continues to be below normal, patient states she is also very fatigued.  Patient saw her PCP Dr. Neta Mends recently with labs and states that everything went well with the exception of a couple of labs being abnormal- potassium and sodium.  Patient is concerned and feels like there is something wrong and wonders if her "adrenal glands aren't functioning well".

## 2015-04-23 ENCOUNTER — Telehealth: Payer: Self-pay | Admitting: Hematology

## 2015-04-23 NOTE — Telephone Encounter (Signed)
I called the patient back. She is very concerned about her hypoerthermia, cold feeling, fatigue.  I spoke with her primary care physician Dr. Neta Mends who checked TSH which was normal.  He is still waiting for a couple other lab test results, if negative, he agrees to refer patient to  Endocrine clinic.   I encouraged patient to start tamoxifen in the next few weeks.  I'll see him back on May 1  Truitt Merle  04/23/2015

## 2015-05-11 ENCOUNTER — Ambulatory Visit (HOSPITAL_BASED_OUTPATIENT_CLINIC_OR_DEPARTMENT_OTHER): Payer: Federal, State, Local not specified - PPO | Admitting: Genetic Counselor

## 2015-05-11 ENCOUNTER — Other Ambulatory Visit: Payer: Federal, State, Local not specified - PPO

## 2015-05-11 ENCOUNTER — Ambulatory Visit (HOSPITAL_BASED_OUTPATIENT_CLINIC_OR_DEPARTMENT_OTHER): Payer: Federal, State, Local not specified - PPO | Admitting: Hematology

## 2015-05-11 ENCOUNTER — Ambulatory Visit: Payer: Federal, State, Local not specified - PPO | Admitting: Hematology

## 2015-05-11 ENCOUNTER — Encounter: Payer: Self-pay | Admitting: Hematology

## 2015-05-11 ENCOUNTER — Encounter: Payer: Self-pay | Admitting: Genetic Counselor

## 2015-05-11 ENCOUNTER — Telehealth: Payer: Self-pay | Admitting: Hematology

## 2015-05-11 ENCOUNTER — Other Ambulatory Visit (HOSPITAL_BASED_OUTPATIENT_CLINIC_OR_DEPARTMENT_OTHER): Payer: Federal, State, Local not specified - PPO

## 2015-05-11 VITALS — BP 109/65 | Temp 98.5°F | Ht 61.0 in | Wt 121.7 lb

## 2015-05-11 DIAGNOSIS — Z803 Family history of malignant neoplasm of breast: Secondary | ICD-10-CM

## 2015-05-11 DIAGNOSIS — C50512 Malignant neoplasm of lower-outer quadrant of left female breast: Secondary | ICD-10-CM

## 2015-05-11 DIAGNOSIS — Z170 Estrogen receptor positive status [ER+]: Secondary | ICD-10-CM

## 2015-05-11 DIAGNOSIS — Z800 Family history of malignant neoplasm of digestive organs: Secondary | ICD-10-CM

## 2015-05-11 LAB — COMPREHENSIVE METABOLIC PANEL
ALT: 10 U/L (ref 0–55)
ANION GAP: 7 meq/L (ref 3–11)
AST: 13 U/L (ref 5–34)
Albumin: 4.3 g/dL (ref 3.5–5.0)
Alkaline Phosphatase: 48 U/L (ref 40–150)
BUN: 12.9 mg/dL (ref 7.0–26.0)
CALCIUM: 9.7 mg/dL (ref 8.4–10.4)
CO2: 26 meq/L (ref 22–29)
CREATININE: 0.8 mg/dL (ref 0.6–1.1)
Chloride: 108 mEq/L (ref 98–109)
EGFR: >90 mL/min/{1.73_m2} (ref >90)
Glucose: 88 mg/dl (ref 70–140)
POTASSIUM: 4.8 meq/L (ref 3.5–5.1)
Sodium: 140 mEq/L (ref 136–145)
Total Bilirubin: <0.3 mg/dL (ref 0.20–1.20)
Total Protein: 7.4 g/dL (ref 6.4–8.3)

## 2015-05-11 LAB — CBC WITH DIFFERENTIAL/PLATELET
BASO%: 1.1 % (ref 0.0–2.0)
BASOS ABS: 0.1 10*3/uL (ref 0.0–0.1)
EOS%: 1.6 % (ref 0.0–7.0)
Eosinophils Absolute: 0.2 10*3/uL (ref 0.0–0.5)
HEMATOCRIT: 37.1 % (ref 34.8–46.6)
HGB: 12.2 g/dL (ref 11.6–15.9)
LYMPH%: 21.4 % (ref 14.0–49.7)
MCH: 28.3 pg (ref 25.1–34.0)
MCHC: 33 g/dL (ref 31.5–36.0)
MCV: 85.9 fL (ref 79.5–101.0)
MONO#: 0.8 10*3/uL (ref 0.1–0.9)
MONO%: 6.6 % (ref 0.0–14.0)
NEUT#: 7.9 10*3/uL — ABNORMAL HIGH (ref 1.5–6.5)
NEUT%: 69.3 % (ref 38.4–76.8)
Platelets: 287 10*3/uL (ref 145–400)
RBC: 4.32 10*6/uL (ref 3.70–5.45)
RDW: 13.7 % (ref 11.2–14.5)
WBC: 11.5 10*3/uL — ABNORMAL HIGH (ref 3.9–10.3)
lymph#: 2.4 10*3/uL (ref 0.9–3.3)

## 2015-05-11 NOTE — Progress Notes (Signed)
[place]  [phone] [phone]  Clinic Follow Up Note   Patient Care Team: Curly Rim, MD as PCP - General (Family Medicine) Truitt Merle, MD as Consulting Physician (Hematology) Autumn Messing III, MD as Consulting Physician (General Surgery) 05/11/2015   CHIEF COMPLAINTS:  Follow Up left breast cancer   Oncology History   Breast cancer of lower-outer quadrant of left [place] breast [facility])   Staging form: Breast, AJCC 7th Edition     Clinical: Stage IIB (T3, N0, M0) - Unsigned     Pathologic stage from 03/09/2015: Stage IIA (T2, N0, cM0) - Signed by Truitt Merle, MD on 03/30/2015       Breast cancer of lower-outer quadrant of left [place] breast ([place])   01/27/2015 Mammogram diagnostic mammogram and ultrasound showed architectural distortion within the left outer breast, 3:00 position.   01/29/2015 Receptors her2 ER 95% positive, PR 85% positive, HER-2 negative,Ki-67 5%.   01/29/2015 Initial Biopsy left breast mass biopsy showed low-grade invasive ductal carcinoma.   02/06/2015 Imaging bilateral breast MRI with and without contrast showed no mass like enhancement measuring 1.4 x 6.3 x 3.9 cm at the left breast 3 to 5:00 position. No adenopathy.   02/12/2015 Initial Diagnosis Breast cancer of lower-outer quadrant of left [place] breast ([place])   03/09/2015 Surgery left breast simple mastectomy, with tissue expander placement, and sentinel lymph node biopsy.   03/09/2015 Pathology Results left breast mastectomy showed grade 1 invasive ductal carcinoma, 4 cm, 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   03/09/2015 Oncotype testing RS 9, which predicts a distant recurrence risk of 6% with tamoxifen alone    HISTORY OF PRESENTING ILLNESS (02/12/2015):  Joann Collins is here because of her newly diagnosed left breast cancer.   She felt a breast lump 2  month ago, no pain, skin change or discharge, nipple change. She otherwise feels well, denies any other symptoms. Her last screening mammogram was normal in April 2014. She moved from [place] to [facility] about 6 months ago. Due to the breast lump, she went to see a gynecologist, and a mammogram was done on 01/27/2015. The mammogram and ultrasound showed a 1.1 cm cyst at 2:00 position which corresponding to the palpable mass, and an area of architectural distortion at the 2 to 3:00 position of the left breast. She underwent ultrasound-guided core needle biopsy of the left breast lesion at 3 clock position. She developed some pain at the biopsy site, and mild skin erythema, mild numbness of the left upper arm.   She was referred to see her breast surgeon Dr. Marlou Starks. Dr. Marlou Starks ordered a breast MRI and discussed the MRI findings with patient. Mastectomy was recommended which is also favored by patient.  CURRENT THERAPY:  Tamoxifen 36m daily started on 04/27/2015   INTERIM HISTORY: Mrs. Joann Pullingreturns for follow-up. She started tamoxifen about two weeks ago, and has been tolerating it well overall. She still complains about being cold some time, with low body temperature. She saw her PCP Dr. CNeta Mendsand lab tests were unremarkable. She also complains of left breast pain from the tissue expender, and she is going to have the reconstruction surgery in mid June.   She had a cyst infection at the tailbone a few weeks ago, was treated with antibiotics by her primary care physician. She has some trouble with sleeping and night, wakes up at  area.  She otherwise feels well no other new complaints.   MEDICAL HISTORY:  Past Medical History  Diagnosis Date  . Spinal headache     with second c-section  . Cancer [facility]     left breast cancer  . Family history of breast cancer   . Family history of pancreatic cancer     SURGICAL HISTORY: Past Surgical History  Procedure Laterality Date  .  Abdominal hysterectomy    . Cesarean section      x 2  . Breast enhancement surgery      2000  . Breast surgery      biopsy  . Tonsillectomy    . Wisdom tooth extraction    . Mastectomy w/ sentinel node biopsy Left 03/09/2015    Procedure: LEFT MASTECTOMY WITH SENTINEL LYMPH NODE MAPPING;  Surgeon: Autumn Messing III, MD;  Location: [place];  Service: General;  Laterality: Left;  . Breast reconstruction with placement of tissue expander and flex hd (acellular hydrated dermis) Left 03/09/2015    Procedure: LEFT BREAST RECONSTRUCTION WITH PLACEMENT OF TISSUE EXPANDER AND POSSIBLE ACELLULAR DERMIS;  Surgeon: Irene Limbo, MD;  Location: [place];  Service: Plastics;  Laterality: Left;    GYN HISTORY  Menarchal: 14 LMP: 2000 before she had hysterectomy Contraceptive: 4 years  HRT: none  G2P2:   SOCIAL HISTORY: Social History   Social History  . Marital Status: Married    Spouse Name: N/A  . Number of Children:  she has a son and a daughter who is 71   . Years of Education: N/A   Occupational History  . Not on file.   Social History Main Topics  . Smoking status: Former Smoker -- 1.00 packs/day for 30 years  . Smokeless tobacco: Not on file  . Alcohol Use: Yes     Comment: socail   . Drug Use: No  . Sexual Activity: Not on file   Other Topics Concern  . Not on file   Social History Narrative   She works for [employer], she is a [employer] manager, she works from home   FAMILY HISTORY: Family History  Problem Relation Age of Onset  . Breast cancer  60  . Breast cancer Maternal Aunt 48  . Breast cancer Maternal Aunt 83  . Heart disease Other   . Heart disease Father   . Heart disease Maternal Uncle   . Pancreatic cancer Paternal Aunt 83  . Heart disease Paternal Uncle   . Alzheimer's disease Maternal Grandmother   . Heart disease Maternal Grandfather   . Heart disease Paternal Grandmother   . Heart disease Paternal Grandfather   . Breast cancer Cousin 55  .  Breast cancer Cousin 16    Maternal cousin's daughter  . Prostate cancer Cousin     Maternal first cousin  . Ovarian cancer Other     Mother's maternal grandmother    ALLERGIES:  is allergic to penicillins.  MEDICATIONS:  Current Outpatient Prescriptions  Medication Sig Dispense Refill  . acyclovir (ZOVIRAX) 800 MG tablet Take 800 mg by mouth daily.    . tamoxifen (NOLVADEX) 20 MG tablet Take 1 tablet (20 mg total) by mouth daily. 30 tablet 2  . methocarbamol (ROBAXIN) 750 MG tablet Take 1 tablet (750 mg total) by mouth every 6 (six) hours as needed (use for muscle cramps/pain). (Patient not taking: Reported on 05/11/2015) 30 tablet 2   No current facility-administered medications for this visit.    REVIEW OF SYSTEMS:  Constitutional: Denies fevers, chills or abnormal night sweats Eyes: Denies blurriness of vision, double vision or watery eyes Ears, nose, mouth, throat, and face: Denies mucositis or sore throat Respiratory: Denies cough, dyspnea or wheezes Cardiovascular: Denies palpitation, chest discomfort or lower extremity swelling Gastrointestinal:  Denies nausea, heartburn or change in bowel habits Skin: Denies abnormal skin rashes Lymphatics: Denies new lymphadenopathy or easy bruising Neurological:Denies numbness, tingling or new weaknesses Behavioral/Psych: Mood is stable, no new changes  All other systems were reviewed with the patient and are negative.  PHYSICAL EXAMINATION: ECOG PERFORMANCE STATUS: 0 - Asymptomatic  Filed Vitals:   05/11/15 1524  BP: 109/65  Temp: 98.5 F (36.9 C)   Filed Weights   05/11/15 1524  Weight: 121 lb 11.2 oz (55.203 kg)    GENERAL:alert, no distress and comfortable SKIN: skin color, texture, turgor are normal, no rashes or significant lesions EYES: normal, conjunctiva are pink and non-injected, sclera clear OROPHARYNX:no exudate, no erythema and lips, buccal mucosa, and tongue normal  NECK: supple, thyroid normal size,  non-tender, without nodularity LYMPH:  no palpable lymphadenopathy in the cervical, axillary or inguinal LUNGS: clear to auscultation and percussion with normal breathing effort HEART: regular rate & rhythm and no murmurs and no lower extremity edema ABDOMEN:abdomen soft, non-tender and normal bowel sounds Musculoskeletal:no cyanosis of digits and no clubbing  PSYCH: alert & oriented x 3 with fluent speech NEURO: no focal motor/sensory deficits Breasts: Breast inspection showed them to be symmetrical with no nipple discharge.  Left breast is surgically absent, she has a tissue expander in place, incision has well-healed. Palpation of the right breast and axilla revealed no obvious mass that I could appreciate.  LABORATORY DATA:  I have reviewed the data as listed CBC Latest Ref Rng 05/11/2015 04/06/2015  WBC 3.9 - 10.3 10e3/uL 11.5(H) 8.0  Hemoglobin 11.6 - 15.9 g/dL 12.2 12.4  Hematocrit 34.8 - 46.6 % 37.1 37.4  Platelets 145 - 400 10e3/uL 287 241    CMP Latest Ref Rng 05/11/2015 04/06/2015  Glucose 70 - 140 mg/dl 88 89  BUN 7.0 - 26.0 mg/dL 12.9 10.4  Creatinine 0.6 - 1.1 mg/dL 0.8 0.8  Sodium 136 - 145 mEq/L 140 140  Potassium 3.5 - 5.1 mEq/L 4.8 5.4(H)  CO2 22 - 29 mEq/L 26 24  Calcium 8.4 - 10.4 mg/dL 9.7 9.7  Total Protein 6.4 - 8.3 g/dL 7.4 7.4  Total Bilirubin 0.20 - 1.20 mg/dL <0.30 0.35  Alkaline Phos 40 - 150 U/L 48 38(L)  AST 5 - 34 U/L 13 13  ALT 0 - 55 U/L 10 10     PATHOLOGY REPORT Diagnosis 03/09/2015 1. Breast, simple mastectomy, Left INVASIVE DUCTAL CARCINOMA, GRADE 1 EXTENSIVE DUCTAL CARCINOMA IN SITU WITH ASSOCIATED CALCIFICATIONS, GRADE 2 CYSTIC INTRADUCTAL PAPILLOMA MARGINS OF RESECTION ARE NEGATIVE FOR TUMOR 2. Lymph node, sentinel, biopsy, Left ONE BENIGN LYMPH NODE (0/1) 3. Lymph node, sentinel, biopsy, Left ONE BENIGN LYMPH NODE (0/1) 4. Lymph node, sentinel, biopsy, Left ONE BENIGN LYMPH NODE (0/1) 5. Lymph node, sentinel, biopsy, Left ONE BENIGN  LYMPH NODE (0/1) Microscopic Comment 1. BREAST, INVASIVE TUMOR, WITH LYMPH NODES PRESENT Specimen, including laterality and lymph node sampling (sentinel, non-sentinel): Left Breast and sentinel lymph nodes Procedure: Simple mastectomy Histologic type: Grade: 1 Tubule formation: 1 Nuclear pleomorphism: 2 Mitotic:1 Tumor size (gross measurement or glass slide measurement): 4 cm Margins: Invasive, distance to closest margin: ) 0.5 cm In-situ, distance to closest margin: 0.3 cm to deep margin If margin positive, focally  or broadly: NA Lymphovascular invasion: Negative Ductal carcinoma in situ: Present Grade: 2 Extensive intraductal component: Predominant Lobular neoplasia: Negative Tumor focality: Focal Treatment effect: Negative If present, treatment effect in breast tissue, lymph nodes or both: NA Extent of tumor: Skin: Negative Nipple: Negative Skeletal muscle: Negative Lymph nodes: Examined: 4 Sentinel 0 Non-sentinel 4 Total Lymph nodes with metastasis: Negative Isolated tumor cells (< 0.2 mm): NA Micrometastasis: (> 0.2 mm and < 2.0 mm): NA Macrometastasis: (> 2.0 mm): NA Extracapsular extension: NA Breast prognostic profile: Estrogen receptor: 95% Progesterone receptor: 80% Her 2 neu: Negative and repeat Ki-67: 3% Non-neoplastic breast  ONCOTYPE DX: RS 9, which predicts a distant recurrence risk of 6% with tamoxifen alone  RADIOGRAPHIC STUDIES: I have personally reviewed the radiological images as listed and agreed with the findings in the report.  Mr Breast Bilateral W Wo Contrast 02/06/2015   FINDINGS: Breast composition: c. Heterogeneous fibroglandular tissue. Background parenchymal enhancement: Moderate. Right breast: No mass or abnormal enhancement. Left breast: There is diffuse non mass like enhancement with plateau enhancement kinetics involving the left breast 3 to 5 o'clock position measuring 1.4 x 6.3 x 3.9 cm with associated clip consistent with patient's  known cancer. The enhancement extends to the skin laterally with mild enhancement of adjacent skin, extension extends to the skin laterally with mild enhancement of the adjacent skin, extension of tumor to the skin is not excluded. Lymph nodes: No abnormal appearing lymph nodes. Ancillary findings:  None.  Bilateral implants are noted. IMPRESSION: Diffuse non masslike enhancement measuring 1.4 x 6.3 x 3.9 cm at the left breast 3 to 5 o'clock position correlating to the patient's known cancer. The enhancement extends to the skin laterally with mild enhancement of adjacent skin, tumor extension in the skin is not excluded. RECOMMENDATION: Treatment plan. BI-RADS CATEGORY  6: Known biopsy-proven malignancy. Electronically Signed   By: Abelardo Diesel M.D.   On: 02/06/2015 15:15   US Breast Ltd Uni Left Inc Axilla 01/27/2015   IMPRESSION: 1. Architectural distortion within the outer LEFT breast, 3 o'clock axis region, at middle depth, slightly inferior to the site of a palpable left breast cyst. This distortion is a suspicious finding for which stereotactic-guided biopsy is recommended. 2. Benign left breast cyst at the 2 o'clock axis, 2 cm from the nipple, corresponding to the palpable lump. 3. Additional benign cysts incidentally noted within the right breast. RECOMMENDATION: Stereotactic-guided biopsy, with tomosynthesis guidance, for the architectural distortion in the outer LEFT breast. Stereotactic-guided biopsy is scheduled for January 19th at 9 a.m. I have discussed the findings and recommendations with the patient. Results were also provided in writing at the conclusion of the visit. If applicable, a reminder letter will be sent to the patient regarding the next appointment. BI-RADS CATEGORY  4: Suspicious. Electronically Signed   By: Franki Cabot M.D.   On: 01/27/2015 17:04    ASSESSMENT & PLAN:  50 year old Caucasian Collins, presented with a palpable left breast lump.   1. 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 -she has stage II disease, tumor measuring 4 cm, low-grade, all 4 sentinel lymph nodes were negative. -I discussed her Oncotype DX genomic testing result. Her tumor has low recurrence score 9, which predicts 6% of 10 year distant recurrence with tamoxifen alone. Given the low risk recurrence score, there is no benefit of adjuvant  chemotherapy and I would not recommend it. -she had a complete surgical resection, likely will do very well, given the low risk of cancer recurrence. - she has started adjuvant tamoxifen, tolerating well so far, we'll continue,  Plan for total of 10 years - we discussed the clinical trial PALLAS study. PALLAS clinical trial counseling: Patients who have completed definitive therapy for breast cancer are randomized to antiestrogen therapy (5+ years) versus antiestrogen therapy plus Palbociclib (2 years). Palbociclib: If she was randomized to Palbociclib, I discussed the risks and benefits of Ibrance including myelosuppression especially neutropenia and with that risk of infection, there is risk of pulmonary embolism and mild peripheral neuropathy as well. Fatigue, nausea, diarrhea, decreased appetite as well as alopecia and thrombocytopenia are also potential side effects of Palbociclib. -patient is not very interested in the clinical trial PALLAS study.  -We'll continue breast cancer surveillance, including annual mammogram, and physical exam.  -lab results reviewed with patient, CBC and CMP are unremarkable. Her physical exam was unremarkable today.  2. Genetics -She has 3 second generation relatives with history of breast cancer, one was 26 at his diagnosis. I recommend her to have genetic testing to ruled out inheritable breast and ovary syndrome. -her genetic testing  results still pending  Plan -continue tamoxifen 20 mg daily  -she will have left breast reconstruction in mid-June -she will see survivorship clinic in 2 months, and see me back in 4 months.  All questions were answered. The patient knows to call the clinic with any problems, questions or concerns. I spent 25 minutes counseling the patient face to face. The total time spent in the appointment was 30 minutes and more than 50% was on counseling.     Truitt Merle, MD 05/11/2015 4:41 PM

## 2015-05-11 NOTE — Telephone Encounter (Signed)
per pof to sch pt appt-gave pt copy of avs °

## 2015-05-11 NOTE — Progress Notes (Addendum)
REFERRING PROVIDER: Curly Rim, MD [place] [address] [place] [address]   Truitt Merle, MD  PRIMARY PROVIDER:  Curly Rim, MD  PRIMARY REASON FOR VISIT:  1. Breast cancer of lower-outer quadrant of left female breast ([place])   2. Family history of breast cancer   3. Family history of pancreatic cancer      HISTORY OF PRESENT ILLNESS:   [name], a 50 y.o. female, was seen for a [place] cancer genetics consultation at the request of Dr. Burr Medico due to a personal and family history of cancer.  [name] presents to clinic today to discuss the possibility of a hereditary predisposition to cancer, genetic testing, and to further clarify her future cancer risks, as well as potential cancer risks for family members.   In January 2017, at the age of 36, 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 The tumor was ER+/PR+/Her2-. This was treated with left mastectomy, but no chemotherapy or radiation.  She will start tamoxifen.    CANCER HISTORY:  Oncology History   Breast cancer of lower-outer quadrant of left female breast [facility])   Staging form: Breast, AJCC 7th Edition     Clinical: Stage IIB (T3, N0, M0) - Unsigned     Pathologic stage from 03/09/2015: Stage IIA (T2, N0, cM0) - Signed by Truitt Merle, MD on 03/30/2015       Breast cancer of lower-outer quadrant of left female breast ([place])   01/27/2015 Mammogram diagnostic mammogram and ultrasound showed architectural distortion within the left outer breast, 3:00 position.   01/29/2015 Receptors her2 ER 95% positive, PR 85% positive, HER-2 negative,Ki-67 5%.   01/29/2015 Initial Biopsy left breast mass biopsy showed low-grade invasive ductal carcinoma.   02/06/2015 Imaging bilateral breast MRI with and without contrast showed no mass like enhancement measuring 1.4 x 6.3 x 3.9 cm at the left breast 3 to 5:00 position. No adenopathy.   02/12/2015 Initial Diagnosis Breast cancer of lower-outer  quadrant of left female breast ([place])   03/09/2015 Surgery left breast simple mastectomy, with tissue expander placement, and sentinel lymph node biopsy.   03/09/2015 Pathology Results left breast mastectomy showed grade 1 invasive ductal carcinoma, 4 cm, 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   03/09/2015 Oncotype testing RS 9, which predicts a distant recurrence risk of 6% with tamoxifen alone     HORMONAL RISK FACTORS:  Menarche was at age 35.  First live birth at age 32.  OCP use for approximately 3 years.  Ovaries intact: yes.  Hysterectomy: yes.  Menopausal status: perimenopausal.  HRT use: 0 years. Colonoscopy: no; not examined. Mammogram within the last year: yes. Number of breast biopsies: 1. Up to date with pelvic exams:  n/a. Any excessive radiation exposure in the past:  no  Past Medical History  Diagnosis Date  . Spinal headache     with second c-section  . Cancer [facility])     left breast cancer  . Family history of breast cancer   . Family history of pancreatic cancer     Past Surgical History  Procedure Laterality Date  . Abdominal hysterectomy    . Cesarean section      x 2  . Breast enhancement surgery      2000  . Breast surgery      biopsy  . Tonsillectomy    . Wisdom  tooth extraction    . Mastectomy w/ sentinel node biopsy Left 03/09/2015    Procedure: LEFT MASTECTOMY WITH SENTINEL LYMPH NODE MAPPING;  Surgeon: Autumn Messing III, MD;  Location: [place];  Service: General;  Laterality: Left;  . Breast reconstruction with placement of tissue expander and flex hd (acellular hydrated dermis) Left 03/09/2015    Procedure: LEFT BREAST RECONSTRUCTION WITH PLACEMENT OF TISSUE EXPANDER AND POSSIBLE ACELLULAR DERMIS;  Surgeon: Irene Limbo, MD;  Location: [place];  Service: Plastics;  Laterality: Left;    Social History   Social History  . Marital Status: Married     Spouse Name: N/A  . Number of Children: 2  . Years of Education: N/A   Social History Main Topics  . Smoking status: Former Smoker -- 1.00 packs/day for 30 years    Types: Cigarettes    Quit date: 02/02/2015  . Smokeless tobacco: Never Used  . Alcohol Use: Yes     Comment: social  . Drug Use: No  . Sexual Activity: Not Asked   Other Topics Concern  . None   Social History Narrative     FAMILY HISTORY:  We obtained a detailed, 4-generation family history.  Significant diagnoses are listed below: Family History  Problem Relation Age of Onset  . Breast cancer  60  . Breast cancer Maternal Aunt 51  . Breast cancer Maternal Aunt 83  . Heart disease Other   . Heart disease Father   . Heart disease Maternal Uncle   . Pancreatic cancer Paternal Aunt 83  . Heart disease Paternal Uncle   . Alzheimer's disease Maternal Grandmother   . Heart disease Maternal Grandfather   . Heart disease Paternal Grandmother   . Heart disease Paternal Grandfather   . Breast cancer Cousin 70  . Breast cancer Cousin 48    Maternal cousin's daughter    The patient has two children who are cancer free. She has one brother who is also healthy.  The patient's father is deceased at 58 from heart disease.  He had one brother and one sister, the sister died of pancratic cancer.  The patient's mother is alive at 69.  She had two sisters and a brother.  Both sisters had breast cancer, one at 81 the other at 42.  The 16 YO had a son who died of prostate cancer. The 37 YO had a daughter who had breast cancer and a granddaughter with breast cancer at age 43.  Both of these cousin's reportedly had genetic testing about 4 years ago and was negative.  The patient's maternal grandmother did not have cancer, but her mother had ovarian cancer.  Patient's maternal ancestors are of [place] descent, and paternal ancestors are of [place] descent. There is no reported Ashkenazi Jewish ancestry. There is no known  consanguinity.  GENETIC COUNSELING ASSESSMENT: ANNSLEY AKKERMAN is a 50 y.o. female with a personal history and family history of breast cancer which is somewhat suggestive of a hereditary cancer syndrome and predisposition to cancer. We, therefore, discussed and recommended the following at today's visit.   DISCUSSION: We discussed that about 5-10% of breast cancer is hereditary with most cases due to BRCA mutations.  Other hereditary mutations can be seen in other genes, we most commonly see them in ATM, PALB2, and CHEK2. The patient's cousin and her daughter reportedly had genetic testing about 4 years ago and was negative. We discussed that about 4 years ago the only testing we had was for BRCA.  We discussed BRCA testing and updated testing that can be done through del/dup as well as panel testing.  If the patient comes back as positive for a hereditary mutation it would be important that her cousins undergo genetics testing again. If she is negative, they may still need updated testing.  We reviewed the characteristics, features and inheritance patterns of hereditary cancer syndromes. We also discussed genetic testing, including the appropriate family members to test, the process of testing, insurance coverage and turn-around-time for results. We discussed the implications of a negative, positive and/or variant of uncertain significant result. We recommended [name] pursue genetic testing for the Breast/Ovarian cancer gene panel.  The Breast/Ovarian gene panel offered by [facility] includes sequencing and rearrangement analysis for the following 20 genes:  ATM, BARD1, BRCA1, BRCA2, BRIP1, CDH1, CHEK2, EPCAM, FANCC, MLH1, MSH2, MSH6, NBN, PALB2, PMS2, PTEN, RAD51C, RAD51D, TP53, and XRCC2.    Based on Ms. Wilson's personal and family history of cancer, she meets medical criteria for genetic testing. Despite that she meets criteria, she may still have an out of pocket cost. We discussed that if her out of  pocket cost for testing is over $100, the laboratory will call and confirm whether she wants to proceed with testing.  If the out of pocket cost of testing is less than $100 she will be billed by the genetic testing laboratory.   PLAN: After considering the risks, benefits, and limitations, Ms. Wilson  provided informed consent to pursue genetic testing and the blood sample was sent to [facility] for analysis of the Breast/Ovarian cancer panel. Results should be available within approximately 2-3 weeks' time, at which point they will be disclosed by telephone to [name], as will any additional recommendations warranted by these results. [name] will receive a summary of her genetic counseling visit and a copy of her results once available. This information will also be available in Epic. We encouraged Ms. Wilson to remain in contact with cancer genetics annually so that we can continuously update the family history and inform her of any changes in cancer genetics and testing that may be of benefit for her family. Ms. Dois Davenport questions were answered to her satisfaction today. Our contact information was provided should additional questions or concerns arise.  Lastly, we encouraged Ms. Wilson to remain in contact with cancer genetics annually so that we can continuously update the family history and inform her of any changes in cancer genetics and testing that may be of benefit for this family.   Ms.  Dois Davenport questions were answered to her satisfaction today. Our contact information was provided should additional questions or concerns arise. Thank you for the referral and allowing [place] to share in the care of your patient.   Ryliegh Mcduffey P. Florene Glen, [place], [facility] Certified Genetic Counselor [email or web address] phone: [phone]  The patient was seen for a total of 45 minutes in face-to-face genetic counseling.  This patient was discussed with Drs. Magrinat, Lindi Adie and/or Burr Medico who agrees with  the above.    _______________________________________________________________________ For Office Staff:  Number of people involved in session: 1 Was an Intern/ student involved with case: no

## 2015-05-14 ENCOUNTER — Other Ambulatory Visit: Payer: Self-pay | Admitting: *Deleted

## 2015-05-14 NOTE — Progress Notes (Signed)
Per conversation with Dr. Marlou Starks patient is requesting  Dr. Lindi Adie for next visit.  POF placed for this.

## 2015-05-15 ENCOUNTER — Telehealth: Payer: Self-pay | Admitting: Hematology and Oncology

## 2015-05-15 NOTE — Telephone Encounter (Signed)
spoke w/ pt confirmed appt change & provider change per pt request per pof

## 2015-06-01 ENCOUNTER — Ambulatory Visit: Payer: Self-pay | Admitting: Genetic Counselor

## 2015-06-01 ENCOUNTER — Telehealth: Payer: Self-pay | Admitting: Genetic Counselor

## 2015-06-01 ENCOUNTER — Encounter (HOSPITAL_BASED_OUTPATIENT_CLINIC_OR_DEPARTMENT_OTHER): Payer: Self-pay | Admitting: *Deleted

## 2015-06-01 DIAGNOSIS — Z800 Family history of malignant neoplasm of digestive organs: Secondary | ICD-10-CM

## 2015-06-01 DIAGNOSIS — Z803 Family history of malignant neoplasm of breast: Secondary | ICD-10-CM

## 2015-06-01 DIAGNOSIS — C50512 Malignant neoplasm of lower-outer quadrant of left female breast: Secondary | ICD-10-CM

## 2015-06-01 DIAGNOSIS — Z1379 Encounter for other screening for genetic and chromosomal anomalies: Secondary | ICD-10-CM | POA: Insufficient documentation

## 2015-06-01 NOTE — Telephone Encounter (Signed)
Patient called to see if her results were back.  Results came in over the weekend.  Revealed negative genetic testing on 20 breast/ovarian cancer genes.  PMS2 VUS was identified.  Explained that we would not change her medical management based on a VUS.  Most of the time VUSs are reclassified as negative.  Patient voiced understanding.

## 2015-06-01 NOTE — Progress Notes (Signed)
HPI: [name] was previously seen in the [place] clinic due to a personal and family history of cancer and concerns regarding a hereditary predisposition to cancer. Please refer to our prior cancer genetics clinic note for more information regarding Joann Collins's medical, social and family histories, and our assessment and recommendations, at the time. [name] recent genetic test results were disclosed to her, as were recommendations warranted by these results. These results and recommendations are discussed in more detail below.  FAMILY HISTORY:  We obtained a detailed, 4-generation family history.  Significant diagnoses are listed below: Family History  Problem Relation Age of Onset  . Breast cancer  60  . Breast cancer Maternal Aunt 32  . Breast cancer Maternal Aunt 83  . Heart disease Other   . Heart disease Father   . Heart disease Maternal Uncle   . Pancreatic cancer Paternal Aunt 83  . Heart disease Paternal Uncle   . Alzheimer's disease Maternal Grandmother   . Heart disease Maternal Grandfather   . Heart disease Paternal Grandmother   . Heart disease Paternal Grandfather   . Breast cancer Cousin 25  . Breast cancer Cousin 61    Maternal cousin's daughter  . Prostate cancer Cousin     Maternal first cousin  . Ovarian cancer Other     Mother's maternal grandmother    The patient has two children who are cancer free. She has one brother who is also healthy. The patient's father is deceased at 74 from heart disease. He had one brother and one sister, the sister died of pancratic cancer. The patient's mother is alive at 70. She had two sisters and a brother. Both sisters had breast cancer, one at 92 the other at 68. The 69 YO had a son who died of prostate cancer. The 20 YO had a daughter who had breast cancer and a granddaughter with breast cancer at age 76. Both of these cousin's reportedly had genetic testing about 4 years ago and was negative. The  patient's maternal grandmother did not have cancer, but her mother had ovarian cancer. Patient's maternal ancestors are of [place] descent, and paternal ancestors are of [place] descent. There is no reported Ashkenazi Jewish ancestry. There is no known consanguinity.  GENETIC TEST RESULTS: At the time of Joann Collins's visit, we recommended she pursue genetic testing of the Breast/Ovarian cancer gene panel. The Breast/Ovarian gene panel offered by [facility] includes sequencing and rearrangement analysis for the following 20 genes:  ATM, BARD1, BRCA1, BRCA2, BRIP1, CDH1, CHEK2, EPCAM, FANCC, MLH1, MSH2, MSH6, NBN, PALB2, PMS2, PTEN, RAD51C, RAD51D, TP53, and XRCC2.   The report date is May 30, 2015.  Genetic testing was normal, and did not reveal a deleterious mutation in these genes. The test report has been scanned into EPIC and is located under the Molecular Pathology section of the Results Review tab.   We discussed with [name] that since the current genetic testing is not perfect, it is possible there may be a gene mutation in one of these genes that current testing cannot detect, but that chance is small. We also discussed, that it is possible that another gene that has not yet been discovered, or that we have not yet tested, is responsible for the cancer diagnoses in the family, and it is, therefore, important to remain in touch with cancer genetics in the future so that we can continue to offer [name] the most up to date genetic testing.  Genetic testing did detect a Variant of Unknown Significance in the PMS2 gene called c.40G>T. At this time, it is unknown if this variant is associated with increased cancer risk or if this is a normal finding, but most variants such as this get reclassified to being inconsequential. It should not be used to make medical management decisions. With time, we suspect the lab will determine the significance of this variant, if any. If we do learn more about  it, we will try to contact Joann Collins to discuss it further. However, it is important to stay in touch with [place] periodically and keep the address and phone number up to date.  CANCER SCREENING RECOMMENDATIONS:  Given Joann Collins's personal and family histories, we must interpret these negative results with some caution.  Families with features suggestive of hereditary risk for cancer tend to have multiple family members with cancer, diagnoses in multiple generations and diagnoses before the age of 83. [name] family exhibits some of these features. Thus this result may simply reflect our current inability to detect all mutations within these genes or there may be a different gene that has not yet been discovered or tested.   RECOMMENDATIONS FOR FAMILY MEMBERS: Women in this family might be at some increased risk of developing cancer, over the general population risk, simply due to the family history of cancer. We recommended women in this family have a yearly mammogram beginning at age 59, or 23 years younger than the earliest onset of cancer, an an annual clinical breast exam, and perform monthly breast self-exams. Women in this family should also have a gynecological exam as recommended by their primary provider. All family members should have a colonoscopy by age 17.  FOLLOW-UP: Lastly, we discussed with [name] that cancer genetics is a rapidly advancing field and it is possible that new genetic tests will be appropriate for her and/or her family members in the future. We encouraged her to remain in contact with cancer genetics on an annual basis so we can update her personal and family histories and let her know of advances in cancer genetics that may benefit this family.   Our contact number was provided. [name] questions were answered to her satisfaction, and she knows she is welcome to call us at anytime with additional questions or concerns.   Roma Kayser, MS, [facility] Certified Genetic  Counselor [email or web address]

## 2015-06-01 NOTE — H&P (Signed)
  Subjective:    Patient ID: Joann Collins is a 50 y.o. female.  Follow-up   Nearly 72months post op.   Presented with palpable mass. MMG and US showed a 1.1 cm cyst at 2:00 position which corresponding to the palpable mass, and an area of architectural distortion at the 2 to 3:00 position of the left breast. Biopsy of the left breast lesion at 3 clock position revealed IDC with DCIS, ER/PR+, Her 2 -. MRI showed diffuse NME measuring 1.4 x 6.3 x 3.9 cm at the left breast 3 to 5 o'clock position. Breast density C. The enhancement extends to the skin laterally with mild enhancement of adjacent skin, tumor extension in the skin is not excluded. Final pathology with negative SLN, IDC 4 cm with margins clear. Oncotype 9- no chemotherapy and on tamoxifen. Genetics testing negative.  History significant for submuscular augmentation with saline. Prior to augmentation A, prior to mastectomy B. Unable to find implant information. Mastectomy wt 226 g, removed McGhan 300 ml implant.  Review of Systems     Objective:   Physical Exam  Cardiovascular: Normal rate, regular rhythm and normal heart sounds.  Pulmonary/Chest: Effort normal.  Abdominal: Soft.  No hernia    Right breast no masses, left medial extent scar with some protuberance in area of striae- this is bothersome to her SN to nipple R 21 BW R 12 Nipple to IMF R 8   Assessment:     Left breast cancer S/p left mastectomy, SLN, TE/ADM (Cortiva) reconstruction Family history breast cancer- genetics negative History breast augmentation    Plan:  . Left medial extent scar with some protuberance in area of striae- this is bothersome to her and will address at implant exchange.  Plan second stage with removal expanderand placement implant. Reviewed saline vs silicone, round vs shaped, textured vs smooth. Reviewed risk ALCL with textured implants. Reviewed risk all implants including rippling, contracture, rupture, infection  requiring surgery possible removal. I again discussed with saline implants there is no need to exchange. I feel her right breast is quite aesthetic and would not intervene now, counseled with time and aging may require breast lift. With regards to left, counseled benefit of changing to silicone may be greater range of projections offered, may have less rippling. Reviewed examples of Inspira implants. At this time, place for round smooth saline as present on right.   Reviewed purpose of fat grafting to aid with contour, rippling. Reviewed variable take graft, may need to repeat, contour depressions or asymmetry donor site, need for compression donor site.    Natrelle 133MX-11-T 300 ml,  fill volume 300 ml    Irene Limbo, MD [facility] Plastic & Reconstructive Surgery [phone], pin [phone]

## 2015-06-05 ENCOUNTER — Ambulatory Visit (HOSPITAL_BASED_OUTPATIENT_CLINIC_OR_DEPARTMENT_OTHER): Payer: Federal, State, Local not specified - PPO | Admitting: Anesthesiology

## 2015-06-05 ENCOUNTER — Encounter (HOSPITAL_BASED_OUTPATIENT_CLINIC_OR_DEPARTMENT_OTHER)
Admission: RE | Disposition: A | Discharge status: Home / Self Care | Payer: Self-pay | Source: Ambulatory Visit | Attending: Plastic Surgery

## 2015-06-05 ENCOUNTER — Encounter (HOSPITAL_BASED_OUTPATIENT_CLINIC_OR_DEPARTMENT_OTHER): Payer: Self-pay | Admitting: *Deleted

## 2015-06-05 ENCOUNTER — Ambulatory Visit (HOSPITAL_BASED_OUTPATIENT_CLINIC_OR_DEPARTMENT_OTHER)
Admission: RE | Admit: 2015-06-05 | Discharge: 2015-06-05 | Disposition: A | Discharge status: Home / Self Care | Payer: Federal, State, Local not specified - PPO | Source: Ambulatory Visit | Attending: Plastic Surgery | Admitting: Plastic Surgery

## 2015-06-05 DIAGNOSIS — Z853 Personal history of malignant neoplasm of breast: Secondary | ICD-10-CM | POA: Insufficient documentation

## 2015-06-05 DIAGNOSIS — Z421 Encounter for breast reconstruction following mastectomy: Secondary | ICD-10-CM | POA: Insufficient documentation

## 2015-06-05 DIAGNOSIS — Z9012 Acquired absence of left breast and nipple: Secondary | ICD-10-CM | POA: Diagnosis not present

## 2015-06-05 DIAGNOSIS — Z87891 Personal history of nicotine dependence: Secondary | ICD-10-CM | POA: Diagnosis not present

## 2015-06-05 HISTORY — PX: LIPOSUCTION WITH LIPOFILLING: SHX6436

## 2015-06-05 HISTORY — PX: REMOVAL OF TISSUE EXPANDER AND PLACEMENT OF IMPLANT: SHX6457

## 2015-06-05 SURGERY — REMOVAL, TISSUE EXPANDER, BREAST, WITH IMPLANT INSERTION
Anesthesia: General | Site: Breast | Laterality: Left

## 2015-06-05 MED ORDER — GLYCOPYRROLATE 0.2 MG/ML IJ SOLN: 0.2000 mg | Freq: Once | INTRAMUSCULAR | Status: DC | PRN

## 2015-06-05 MED ORDER — MIDAZOLAM HCL 2 MG/2ML IJ SOLN
1.0000 mg | INTRAMUSCULAR | Status: DC | PRN
  Administered 2015-06-05: 2 mg via INTRAVENOUS

## 2015-06-05 MED ORDER — LIDOCAINE HCL (PF) 1 % IJ SOLN
INTRAMUSCULAR | Status: AC
  Filled 2015-06-05: qty 60

## 2015-06-05 MED ORDER — FENTANYL CITRATE (PF) 100 MCG/2ML IJ SOLN
INTRAMUSCULAR | Status: AC
  Filled 2015-06-05: qty 2

## 2015-06-05 MED ORDER — SUCCINYLCHOLINE CHLORIDE 20 MG/ML IJ SOLN
INTRAMUSCULAR | Status: DC | PRN
  Administered 2015-06-05: 100 mg via INTRAVENOUS

## 2015-06-05 MED ORDER — ONDANSETRON HCL 4 MG/2ML IJ SOLN
INTRAMUSCULAR | Status: AC
  Filled 2015-06-05: qty 2

## 2015-06-05 MED ORDER — OXYCODONE HCL 5 MG PO TABS
5.0000 mg | ORAL_TABLET | Freq: Once | ORAL | Status: AC
  Administered 2015-06-05: 5 mg via ORAL

## 2015-06-05 MED ORDER — PROPOFOL 10 MG/ML IV BOLUS
INTRAVENOUS | Status: DC | PRN
  Administered 2015-06-05: 150 mg via INTRAVENOUS

## 2015-06-05 MED ORDER — SODIUM CHLORIDE 0.9 % IV SOLN
INTRAVENOUS | Status: DC | PRN
  Administered 2015-06-05: 400 mL

## 2015-06-05 MED ORDER — DEXAMETHASONE SODIUM PHOSPHATE 4 MG/ML IJ SOLN
INTRAMUSCULAR | Status: DC | PRN
  Administered 2015-06-05: 10 mg via INTRAVENOUS

## 2015-06-05 MED ORDER — METOCLOPRAMIDE HCL 5 MG/ML IJ SOLN: 10.0000 mg | Freq: Once | INTRAMUSCULAR | Status: DC | PRN

## 2015-06-05 MED ORDER — VANCOMYCIN HCL IN DEXTROSE 1-5 GM/200ML-% IV SOLN
INTRAVENOUS | Status: AC
  Filled 2015-06-05: qty 200

## 2015-06-05 MED ORDER — SODIUM BICARBONATE 4 % IV SOLN
INTRAVENOUS | Status: AC
  Filled 2015-06-05: qty 10

## 2015-06-05 MED ORDER — ONDANSETRON HCL 4 MG/2ML IJ SOLN
INTRAMUSCULAR | Status: DC | PRN
  Administered 2015-06-05: 4 mg via INTRAVENOUS

## 2015-06-05 MED ORDER — EPHEDRINE 5 MG/ML INJ
INTRAVENOUS | Status: AC
  Filled 2015-06-05: qty 10

## 2015-06-05 MED ORDER — HYDROMORPHONE HCL 1 MG/ML IJ SOLN
0.5000 mg | INTRAMUSCULAR | Status: DC | PRN
  Administered 2015-06-05: 0.5 mg via INTRAVENOUS

## 2015-06-05 MED ORDER — MEPERIDINE HCL 25 MG/ML IJ SOLN: 6.2500 mg | INTRAMUSCULAR | Status: DC | PRN

## 2015-06-05 MED ORDER — SODIUM BICARBONATE 4 % IV SOLN
INTRAVENOUS | Status: DC | PRN
  Administered 2015-06-05: 220 mL via INTRAMUSCULAR

## 2015-06-05 MED ORDER — EPHEDRINE SULFATE 50 MG/ML IJ SOLN
INTRAMUSCULAR | Status: DC | PRN
  Administered 2015-06-05: 10 mg via INTRAVENOUS

## 2015-06-05 MED ORDER — POVIDONE-IODINE 10 % EX SOLN
CUTANEOUS | Status: DC | PRN
  Administered 2015-06-05: 1 via TOPICAL

## 2015-06-05 MED ORDER — LIDOCAINE HCL (CARDIAC) 20 MG/ML IV SOLN
INTRAVENOUS | Status: DC | PRN
  Administered 2015-06-05: 50 mg via INTRAVENOUS

## 2015-06-05 MED ORDER — VANCOMYCIN HCL IN DEXTROSE 1-5 GM/200ML-% IV SOLN
1000.0000 mg | INTRAVENOUS | Status: AC
Start: 2015-06-06 — End: 2015-06-05
  Administered 2015-06-05: 1000 mg via INTRAVENOUS

## 2015-06-05 MED ORDER — CHLORHEXIDINE GLUCONATE 4 % EX LIQD: 1.0000 "application " | Freq: Once | CUTANEOUS | Status: DC

## 2015-06-05 MED ORDER — FENTANYL CITRATE (PF) 100 MCG/2ML IJ SOLN
25.0000 ug | INTRAMUSCULAR | Status: DC | PRN
  Administered 2015-06-05: 50 ug via INTRAVENOUS
  Administered 2015-06-05 (×2): 25 ug via INTRAVENOUS
  Administered 2015-06-05: 50 ug via INTRAVENOUS

## 2015-06-05 MED ORDER — SCOPOLAMINE 1 MG/3DAYS TD PT72: 1.0000 | MEDICATED_PATCH | Freq: Once | TRANSDERMAL | Status: DC | PRN

## 2015-06-05 MED ORDER — METHOCARBAMOL 500 MG PO TABS: 500.0000 mg | ORAL_TABLET | Freq: Three times a day (TID) | ORAL | Status: DC | PRN

## 2015-06-05 MED ORDER — SULFAMETHOXAZOLE-TRIMETHOPRIM 800-160 MG PO TABS: 1.0000 | ORAL_TABLET | Freq: Two times a day (BID) | ORAL | Status: DC

## 2015-06-05 MED ORDER — LIDOCAINE-EPINEPHRINE 0.5 %-1:200000 IJ SOLN
INTRAMUSCULAR | Status: AC
Start: 2015-06-05 — End: 2015-06-05
  Filled 2015-06-05: qty 1

## 2015-06-05 MED ORDER — PROMETHAZINE HCL 25 MG/ML IJ SOLN: 12.5000 mg | INTRAMUSCULAR | Status: DC | PRN

## 2015-06-05 MED ORDER — POLYMYXIN B SULFATE 500000 UNITS IJ SOLR
INTRAMUSCULAR | Status: DC | PRN
  Administered 2015-06-05: 500 mL

## 2015-06-05 MED ORDER — LIDOCAINE HCL (PF) 1 % IJ SOLN
INTRAMUSCULAR | Status: AC
Start: 2015-06-05 — End: 2015-06-05
  Filled 2015-06-05: qty 60

## 2015-06-05 MED ORDER — HYDROMORPHONE HCL 1 MG/ML IJ SOLN
INTRAMUSCULAR | Status: AC
  Filled 2015-06-05: qty 1

## 2015-06-05 MED ORDER — OXYCODONE-ACETAMINOPHEN 5-325 MG PO TABS: 1.0000 | ORAL_TABLET | ORAL | Status: DC | PRN

## 2015-06-05 MED ORDER — FENTANYL CITRATE (PF) 100 MCG/2ML IJ SOLN
50.0000 ug | INTRAMUSCULAR | Status: AC | PRN
  Administered 2015-06-05 (×2): 25 ug via INTRAVENOUS
  Administered 2015-06-05: 100 ug via INTRAVENOUS

## 2015-06-05 MED ORDER — DEXAMETHASONE SODIUM PHOSPHATE 10 MG/ML IJ SOLN
INTRAMUSCULAR | Status: AC
Start: 2015-06-05 — End: 2015-06-05
  Filled 2015-06-05: qty 1

## 2015-06-05 MED ORDER — MIDAZOLAM HCL 2 MG/2ML IJ SOLN
INTRAMUSCULAR | Status: AC
  Filled 2015-06-05: qty 2

## 2015-06-05 MED ORDER — LIDOCAINE 2% (20 MG/ML) 5 ML SYRINGE
INTRAMUSCULAR | Status: AC
  Filled 2015-06-05: qty 5

## 2015-06-05 MED ORDER — LACTATED RINGERS IV SOLN
INTRAVENOUS | Status: DC
  Administered 2015-06-05: 13:00:00 via INTRAVENOUS

## 2015-06-05 MED ORDER — OXYCODONE HCL 5 MG PO TABS
ORAL_TABLET | ORAL | Status: AC
  Filled 2015-06-05: qty 1

## 2015-06-05 SURGICAL SUPPLY — 91 items
BAG DECANTER FOR FLEXI CONT (MISCELLANEOUS) ×3 IMPLANT
BINDER ABDOMINAL 10 UNV 27-48 (MISCELLANEOUS) ×6 IMPLANT
BINDER ABDOMINAL 12 SM 30-45 (SOFTGOODS) IMPLANT
BINDER BREAST LRG (GAUZE/BANDAGES/DRESSINGS) ×3 IMPLANT
BINDER BREAST MEDIUM (GAUZE/BANDAGES/DRESSINGS) IMPLANT
BINDER BREAST XLRG (GAUZE/BANDAGES/DRESSINGS) IMPLANT
BINDER BREAST XXLRG (GAUZE/BANDAGES/DRESSINGS) IMPLANT
BLADE SURG 10 STRL SS (BLADE) ×6 IMPLANT
BLADE SURG 11 STRL SS (BLADE) ×3 IMPLANT
BLADE SURG 15 STRL LF DISP TIS (BLADE) ×1 IMPLANT
BLADE SURG 15 STRL SS (BLADE) ×2
BNDG GAUZE ELAST 4 BULKY (GAUZE/BANDAGES/DRESSINGS) ×6 IMPLANT
CANISTER LIPO FAT HARVEST (MISCELLANEOUS) ×3 IMPLANT
CANISTER SUCT 1200ML W/VALVE (MISCELLANEOUS) ×3 IMPLANT
CHLORAPREP W/TINT 26ML (MISCELLANEOUS) ×6 IMPLANT
COMPRESSION GARMENT LG MOREWEL (MISCELLANEOUS) IMPLANT
COMPRESSION GARMENT MD MOREWEL (MISCELLANEOUS) ×3 IMPLANT
COMPRESSION GARMENT XL MOREWEL (MISCELLANEOUS) IMPLANT
COVER BACK TABLE 60X90IN (DRAPES) IMPLANT
COVER MAYO STAND STRL (DRAPES) ×3 IMPLANT
DECANTER SPIKE VIAL GLASS SM (MISCELLANEOUS) IMPLANT
DRAIN CHANNEL 15F RND FF W/TCR (WOUND CARE) IMPLANT
DRAPE IMP U-DRAPE 54X76 (DRAPES) IMPLANT
DRAPE LAPAROSCOPIC ABDOMINAL (DRAPES) IMPLANT
DRSG PAD ABDOMINAL 8X10 ST (GAUZE/BANDAGES/DRESSINGS) ×6 IMPLANT
DRSG TEGADERM 4X4.75 (GAUZE/BANDAGES/DRESSINGS) ×3 IMPLANT
ELECT BLADE 4.0 EZ CLEAN MEGAD (MISCELLANEOUS) ×3
ELECT COATED BLADE 2.86 ST (ELECTRODE) ×3 IMPLANT
ELECT REM PT RETURN 9FT ADLT (ELECTROSURGICAL) ×3
ELECTRODE BLDE 4.0 EZ CLN MEGD (MISCELLANEOUS) ×1 IMPLANT
ELECTRODE REM PT RTRN 9FT ADLT (ELECTROSURGICAL) ×1 IMPLANT
EVACUATOR SILICONE 100CC (DRAIN) IMPLANT
FILTER LIPOSUCTION (MISCELLANEOUS) ×3 IMPLANT
GLOVE BIO SURGEON STRL SZ 6 (GLOVE) ×3 IMPLANT
GLOVE BIO SURGEON STRL SZ 6.5 (GLOVE) IMPLANT
GLOVE BIO SURGEON STRL SZ7 (GLOVE) ×6 IMPLANT
GLOVE BIO SURGEONS STRL SZ 6.5 (GLOVE)
GLOVE BIOGEL PI IND STRL 7.5 (GLOVE) ×2 IMPLANT
GLOVE BIOGEL PI INDICATOR 7.5 (GLOVE) ×4
GLOVE SURG SS PI 7.0 STRL IVOR (GLOVE) ×3 IMPLANT
GOWN STRL REUS W/ TWL LRG LVL3 (GOWN DISPOSABLE) ×3 IMPLANT
GOWN STRL REUS W/TWL LRG LVL3 (GOWN DISPOSABLE) ×6
IMPL BREAST SALINE HP 400CC (Breast) ×1 IMPLANT
IMPLANT BREAST SALINE HP 400CC (Breast) ×3 IMPLANT
IV NS 500ML (IV SOLUTION) ×2
IV NS 500ML BAXH (IV SOLUTION) ×1 IMPLANT
KIT FILL SYSTEM UNIVERSAL (SET/KITS/TRAYS/PACK) ×3 IMPLANT
LINER CANISTER 1000CC FLEX (MISCELLANEOUS) ×3 IMPLANT
LIQUID BAND (GAUZE/BANDAGES/DRESSINGS) ×3 IMPLANT
MANIFOLD NEPTUNE II (INSTRUMENTS) IMPLANT
NDL SAFETY ECLIPSE 18X1.5 (NEEDLE) IMPLANT
NEEDLE HYPO 18GX1.5 SHARP (NEEDLE)
NEEDLE HYPO 25X1 1.5 SAFETY (NEEDLE) IMPLANT
NS IRRIG 1000ML POUR BTL (IV SOLUTION) ×3 IMPLANT
PACK BASIN DAY SURGERY FS (CUSTOM PROCEDURE TRAY) ×3 IMPLANT
PACK UNIVERSAL I (CUSTOM PROCEDURE TRAY) ×3 IMPLANT
PAD ALCOHOL SWAB (MISCELLANEOUS) ×3 IMPLANT
PENCIL BUTTON HOLSTER BLD 10FT (ELECTRODE) ×3 IMPLANT
PIN SAFETY STERILE (MISCELLANEOUS) ×3 IMPLANT
SHEET MEDIUM DRAPE 40X70 STRL (DRAPES) ×6 IMPLANT
SIZER BREAST SGL USE HP 400CC (SIZER) ×6
SIZER BREAST SGL USE HP 425CC (SIZER) ×3
SIZER BREAST SGL USE HP 465CC (SIZER) ×6
SIZER BRST SGL USE HP 400CC (SIZER) ×2 IMPLANT
SIZER BRST SGL USE HP 425CC (SIZER) ×1 IMPLANT
SIZER BRST SGL USE HP 465CC (SIZER) ×2 IMPLANT
SLEEVE SCD COMPRESS KNEE MED (MISCELLANEOUS) ×3 IMPLANT
SPONGE LAP 18X18 X RAY DECT (DISPOSABLE) ×6 IMPLANT
STAPLER VISISTAT 35W (STAPLE) ×3 IMPLANT
SUT ETHILON 2 0 FS 18 (SUTURE) IMPLANT
SUT MNCRL AB 4-0 PS2 18 (SUTURE) ×3 IMPLANT
SUT PDS 3-0 CT2 (SUTURE)
SUT PDS AB 2-0 CT2 27 (SUTURE) IMPLANT
SUT PDS II 3-0 CT2 27 ABS (SUTURE) IMPLANT
SUT VIC AB 3-0 PS1 18 (SUTURE)
SUT VIC AB 3-0 PS1 18XBRD (SUTURE) IMPLANT
SUT VIC AB 3-0 SH 27 (SUTURE) ×2
SUT VIC AB 3-0 SH 27X BRD (SUTURE) ×1 IMPLANT
SUT VICRYL 4-0 PS2 18IN ABS (SUTURE) ×6 IMPLANT
SYR 50ML LL SCALE MARK (SYRINGE) ×6 IMPLANT
SYR BULB IRRIGATION 50ML (SYRINGE) ×3 IMPLANT
SYR CONTROL 10ML LL (SYRINGE) IMPLANT
SYR TB 1ML LL NO SAFETY (SYRINGE) ×3 IMPLANT
SYRINGE 10CC LL (SYRINGE) ×15 IMPLANT
TOWEL OR 17X24 6PK STRL BLUE (TOWEL DISPOSABLE) ×9 IMPLANT
TUBE CONNECTING 20'X1/4 (TUBING) ×2
TUBE CONNECTING 20X1/4 (TUBING) ×4 IMPLANT
TUBING INFILTRATION IT-10001 (TUBING) ×3 IMPLANT
TUBING SET GRADUATE ASPIR 12FT (MISCELLANEOUS) ×3 IMPLANT
UNDERPAD 30X30 (UNDERPADS AND DIAPERS) ×6 IMPLANT
YANKAUER SUCT BULB TIP NO VENT (SUCTIONS) ×3 IMPLANT

## 2015-06-05 NOTE — Discharge Instructions (Signed)

## 2015-06-05 NOTE — Anesthesia Procedure Notes (Signed)
Procedure Name: Intubation Performed by: Terrance Mass Pre-anesthesia Checklist: Patient identified, Emergency Drugs available, Suction available and Patient being monitored Patient Re-evaluated:Patient Re-evaluated prior to inductionOxygen Delivery Method: Circle System Utilized Preoxygenation: Pre-oxygenation with 100% oxygen Intubation Type: IV induction Ventilation: Mask ventilation without difficulty Tube type: Oral Tube size: 7.0 mm Number of attempts: 1 Airway Equipment and Method: Stylet and Oral airway Placement Confirmation: ETT inserted through vocal cords under direct vision,  positive ETCO2 and breath sounds checked- equal and bilateral Secured at: 22 cm Tube secured with: Tape Dental Injury: Teeth and Oropharynx as per pre-operative assessment

## 2015-06-05 NOTE — Anesthesia Preprocedure Evaluation (Addendum)
Anesthesia Evaluation  Patient identified by MRN, date of birth, ID band Patient awake    Reviewed: Allergy & Precautions, NPO status , Patient's Chart, lab work & pertinent test results  History of Anesthesia Complications (+) POST - OP SPINAL HEADACHE and history of anesthetic complications  Airway Mallampati: I  TM Distance: >3 FB Neck ROM: Full    Dental   Pulmonary former smoker,    Pulmonary exam normal breath sounds clear to auscultation       Cardiovascular negative cardio ROS Normal cardiovascular exam Rhythm:Regular Rate:Normal     Neuro/Psych  Headaches, negative psych ROS   GI/Hepatic negative GI ROS, Neg liver ROS,   Endo/Other  Hx/o Breast Ca   Renal/GU negative Renal ROS  negative genitourinary   Musculoskeletal negative musculoskeletal ROS (+)   Abdominal   Peds  Hematology   Anesthesia Other Findings   Reproductive/Obstetrics                            Anesthesia Physical Anesthesia Plan  ASA: II  Anesthesia Plan: General   Post-op Pain Management:    Induction: Intravenous  Airway Management Planned: Oral ETT  Additional Equipment:   Intra-op Plan:   Post-operative Plan: Extubation in OR  Informed Consent: I have reviewed the patients History and Physical, chart, labs and discussed the procedure including the risks, benefits and alternatives for the proposed anesthesia with the patient or authorized representative who has indicated his/her understanding and acceptance.     Plan Discussed with: CRNA, Surgeon and Anesthesiologist  Anesthesia Plan Comments:        Anesthesia Quick Evaluation

## 2015-06-05 NOTE — Anesthesia Postprocedure Evaluation (Signed)
Anesthesia Post Note  Patient: Joann Collins  Procedure(s) Performed: Procedure(s) (LRB): REMOVAL OF LEFT TISSUE EXPANDER AND PLACEMENT OF SALINE IMPLANT (Left) LIPOSUCTION WITH LIPOFILLING TO LEFT CHEST (Left)  Patient location during evaluation: PACU Anesthesia Type: General Level of consciousness: awake and alert Pain management: pain level controlled Vital Signs Assessment: post-procedure vital signs reviewed and stable Respiratory status: spontaneous breathing, nonlabored ventilation, respiratory function stable and patient connected to nasal cannula oxygen Cardiovascular status: blood pressure returned to baseline and stable Postop Assessment: no signs of nausea or vomiting Anesthetic complications: no    Last Vitals:  Filed Vitals:   06/05/15 1730 06/05/15 1830  BP: 117/83 118/77  Pulse: 82 79  Temp:  36.6 C  Resp: 15 14    Last Pain:  Filed Vitals:   06/05/15 1836  PainSc: 3                  Shakea Isip L

## 2015-06-05 NOTE — Interval H&P Note (Signed)
History and Physical Interval Note:  06/05/2015 1:34 PM  Joann Collins  has presented today for surgery, with the diagnosis of history of left breast cancer and acquired absnce of breast  The various methods of treatment have been discussed with the patient and family. After consideration of risks, benefits and other options for treatment, the patient has consented to  Procedure(s): REMOVAL OF LEFT TISSUE EXPANDER AND PLACEMENT OF IMPLANT (Left) LIPOFILLING TO LEFT CHEST (Left) as a surgical intervention .  The patient's history has been reviewed, patient examined, no change in status, stable for surgery.  I have reviewed the patient's chart and labs.  Questions were answered to the patient's satisfaction.     Destin Kittler

## 2015-06-05 NOTE — Transfer of Care (Signed)
Immediate Anesthesia Transfer of Care Note  Patient: Joann Collins  Procedure(s) Performed: Procedure(s): REMOVAL OF LEFT TISSUE EXPANDER AND PLACEMENT OF IMPLANT (Left) LIPOFILLING TO LEFT CHEST (Left)  Patient Location: PACU  Anesthesia Type:General  Level of Consciousness: awake and patient cooperative  Airway & Oxygen Therapy: Patient Spontanous Breathing and Patient connected to face mask oxygen  Post-op Assessment: Report given to RN and Post -op Vital signs reviewed and stable  Post vital signs: Reviewed and stable  Last Vitals:  Filed Vitals:   06/05/15 1226  BP: 102/60  Pulse: 71  Temp: 37 C  Resp: 16    Last Pain: There were no vitals filed for this visit.    Patients Stated Pain Goal: 0 ([phone])  Complications: No apparent anesthesia complications

## 2015-06-05 NOTE — Op Note (Signed)
Operative Note   DATE OF OPERATION: 5.26.17  LOCATION: [facility]-outpatient  SURGICAL DIVISION: Plastic Surgery  PREOPERATIVE DIAGNOSES:  1. 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 History breast augmentation  POSTOPERATIVE DIAGNOSES:  same  PROCEDURE:  1. Removal left tissue expander and placement saline implant 2. Lipofilling from abdomen to left chest  SURGEON: Irene Limbo MD MBA  ASSISTANT: none  ANESTHESIA:  General.   EBL: 25 ml  COMPLICATIONS: None immediate.   INDICATIONS FOR PROCEDURE:  The patient, Joann Collins, is a 50 y.o. female born on October 29, 1965, is here for second stage reconstruction following immediate expander, acellular dermis reconstruction. She has undergone prior saline breast augmentation and this implant remains in place on right.   FINDINGS: Cortiva ADM well incorporated. Natrelle Smooth Round High Profile Saline implant placed, Ref [id number], fill volume 400 ml . SN [id number] 100 ml fat infiltrated over left reconstructed breast.  DESCRIPTION OF PROCEDURE:  The patient's operative site was marked with the patient in the preoperative area including sternal notch, chest midline, anterior axillary lines, inframammary folds and areas of depression for planned lipofilling. Bilateral flanks and infraumbilical abdomen marked for fat harvest. The patient was taken to the operating room. SCDs were placed and IV antibiotics were given. The patient's operative site was prepped and draped in a sterile fashion. Nipple shield applied to right breast. A time out was performed and all information was confirmed to be correct.  Incision made in prior left chest mastectomy scar. Skin flaps elevated in limited fashion off underlying pectoralis muscle. Muscle divided in direction of fibers and expander removed. As noted, ADM well incorporated. Capsulotomies performed medially and superiorly. Patient brought to upright sitting position and sizers  utilized, Natrelle High Profile 400 ml implant chosen. Patient returned to supine position and stab incisions made over bilateral abdomen. Tumescent infiltrated and power assisted liposuction performed over bilateral flanks and infraumbilical abdomen, total lipoaspirate 200 ml. The harvested fat was washed and prepared by gravity. The fat was infiltrated throughout subcutaneous and submuscular mastectomy flaps, axilla and lateral chest wall, total 100 ml. Cavity irrigated with polymyxin-bacitracin solution and inspected for hemostasis. Pocked then irrigated with Betadine. Implant prepared and placed in cavity. Implant filled to 400 ml and fill tubing removed, tab closure ensured. Closure completed with 3-0 vicryl to approximate muscle and capsule followed by 4-0 vicryl in dermis, 4-0 monocryl subcuticular skin closure. Tissue adhesive applied. Abdominal incisions closed with simple 4-0 monocryl. Dry dressing, abdominal and breast binders applied.   The patient was allowed to wake from anesthesia, extubated and taken to the recovery room in satisfactory condition.   SPECIMENS: none  DRAINS: none  Irene Limbo, MD [facility] Plastic & Reconstructive Surgery [phone], pin [phone]

## 2015-06-09 ENCOUNTER — Encounter (HOSPITAL_BASED_OUTPATIENT_CLINIC_OR_DEPARTMENT_OTHER): Payer: Self-pay | Admitting: Plastic Surgery

## 2015-06-16 ENCOUNTER — Encounter (HOSPITAL_COMMUNITY): Payer: Self-pay

## 2015-06-23 ENCOUNTER — Encounter: Payer: Federal, State, Local not specified - PPO | Admitting: Nurse Practitioner

## 2015-06-29 ENCOUNTER — Other Ambulatory Visit: Payer: Self-pay

## 2015-06-29 NOTE — Progress Notes (Signed)
Received VM from pt with some concerns since starting Tamoxifen therapy 3 months ago.  Pt reporting teeth pain x [redacted] weeks along with new onset of gingivitis which she is seeing a dentist for as well as pain to her right heel.  Pt reports no other changes prior to the onset of these symptoms except for start of tamoxifen and wishes to be seen sooner than next follow up appointment.  Reviewed this information with Dr. Lindi Adie who verbalized for pt to come in in the next 2 weeks for evaluation.  POF entered for this appointment to be scheduled.  Pt informed to notify us with any new concerns or questions, should they arise.  Pt without further questions at time of call.

## 2015-07-09 ENCOUNTER — Encounter: Payer: Self-pay | Admitting: Hematology and Oncology

## 2015-07-09 ENCOUNTER — Ambulatory Visit (HOSPITAL_BASED_OUTPATIENT_CLINIC_OR_DEPARTMENT_OTHER): Payer: Federal, State, Local not specified - PPO | Admitting: Hematology and Oncology

## 2015-07-09 ENCOUNTER — Encounter: Payer: Self-pay | Admitting: Medical Oncology

## 2015-07-09 VITALS — BP 118/68 | HR 83 | Temp 98.7°F | Ht 61.0 in | Wt 120.1 lb

## 2015-07-09 DIAGNOSIS — N951 Menopausal and female climacteric states: Secondary | ICD-10-CM | POA: Diagnosis not present

## 2015-07-09 DIAGNOSIS — M2550 Pain in unspecified joint: Secondary | ICD-10-CM | POA: Diagnosis not present

## 2015-07-09 DIAGNOSIS — C50512 Malignant neoplasm of lower-outer quadrant of left female breast: Secondary | ICD-10-CM

## 2015-07-09 NOTE — Assessment & Plan Note (Signed)
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 Pathologic staging: T2 N0 stage II a Oncotype DX score: 9, low risk, 6% risk of recurrence Genetic testing: No mutations identified, VUS PMS-2 Breast reconstruction  -------------------------------------------------------------------------------------------------- Current treatment: Tamoxifen 20 mg daily started March 2017 Patient was counseled about clinical trial PALLAS but she was not interested.  Tamoxifen toxicities:  Return to clinic in 6 months for follow-up

## 2015-07-09 NOTE — Progress Notes (Signed)
Patient Care Team: Curly Rim, MD as PCP - General (Family Medicine) Truitt Merle, MD as Consulting Physician (Hematology) Autumn Messing III, MD as Consulting Physician (General Surgery)  DIAGNOSIS: Breast cancer of lower-outer quadrant of left female breast [facility])   Staging form: Breast, AJCC 7th Edition     Clinical: Stage IIB (T3, N0, M0) - Unsigned     Pathologic stage from 03/09/2015: Stage IIA (T2, N0, cM0) - Signed by Truitt Merle, MD on 03/30/2015   SUMMARY OF ONCOLOGIC HISTORY: Oncology History   Breast cancer of lower-outer quadrant of left female breast ([place])   Staging form: Breast, AJCC 7th Edition     Clinical: Stage IIB (T3, N0, M0) - Unsigned     Pathologic stage from 03/09/2015: Stage IIA (T2, N0, cM0) - Signed by Truitt Merle, MD on 03/30/2015       Breast cancer of lower-outer quadrant of left female breast ([place])   01/27/2015 Mammogram diagnostic mammogram and ultrasound showed architectural distortion within the left outer breast, 3:00 position. Area of biopsied architectural distortion measures approx 2.2 x 1.8 x 1.4 cm.    01/29/2015 Initial Biopsy (L) breast mass biopsy showed low-grade invasive ductal carcinoma & DCIS   01/29/2015 Receptors her2 ER 95% positive, PR 80% positive, HER-2 negative (ratio 1.22), Ki-67 3%.   02/06/2015 Breast MRI Non-masslike enhancement measuring 1.4 x 6.3 x 3.9 cm at the left breast 3 to 5:00 position. Enhancement extends to skin laterally with mild enhancement of adjacent skin, tumor extension in skin not excluded. No adenopathy. Bilat implants noted    03/09/2015 Surgery (L) breast simple mastectomy with sentinel lymph node biopsy,  with tissue expander placement Marlou Starks)   03/09/2015 Pathology Results (L) breast mastectomy showed grade 1 invasive ductal carcinoma, 4 cm, extensive DCIS with associated calcs, grade 2, surgical margins were negative, 4 sentinel lymph nodes were negative.   03/09/2015 Oncotype testing RS 9, which predicts a distant  recurrence risk of 6% with tamoxifen alone (low risk)   03/09/2015 Pathologic Stage pT2, pN0: Stage IIA   05/11/2015 Miscellaneous Genetic testing. 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 Genes evaluated: ATM, BARD1, BRCA1, BRCA2, BRIP1, CDH1, CHEK2, EPCAM, FANCC, MLH1, MSH2, MSH6, NBN, PALB2, PMS2, PTEN, RAD51C, RAD51D, TP53, & XRCC2.    06/06/2015 Surgery Removal of left breast tissue expander and placement of saline implant ([place])    CHIEF COMPLIANT: Follow-up on tamoxifen therapy  INTERVAL HISTORY: Joann Collins is a 50 year old with above-mentioned history of left breast cancer stage II disease low risk based upon Oncotype DX and did not need chemotherapy. She had undergone mastectomy followed by breast reconstruction. She has been tolerating tamoxifen reasonably well. She did have a lot of depression symptoms which were improved with primrose oil. She reports occasional muscle aches and pains and occasional hot flashes. Overall she appears to be tolerating tamoxifen fairly well.  REVIEW OF SYSTEMS:   Constitutional: Denies fevers, chills or abnormal weight loss Eyes: Denies blurriness of vision Ears, nose, mouth, throat, and face: Denies mucositis or sore throat Respiratory: Denies cough, dyspnea or wheezes Cardiovascular: Denies palpitation, chest discomfort Gastrointestinal:  Denies nausea, heartburn or change in bowel habits Skin: Denies abnormal skin rashes Lymphatics: Denies new lymphadenopathy or easy bruising Neurological:Denies numbness, tingling or new weaknesses Behavioral/Psych: Mood is stable, no new changes  Extremities: No lower extremity edema Breast:  denies any pain or lumps or nodules in either breasts All other systems were reviewed with the patient and are negative.  I have reviewed  the past medical history, past surgical history, social history and family history with the patient and they are unchanged from previous note.  ALLERGIES:  is allergic to  penicillins.  MEDICATIONS:  Current Outpatient Prescriptions  Medication Sig Dispense Refill  . acyclovir (ZOVIRAX) 800 MG tablet Take 800 mg by mouth daily.    . methocarbamol (ROBAXIN) 500 MG tablet Take 1 tablet (500 mg total) by mouth every 8 (eight) hours as needed for muscle spasms. 30 tablet 0  . Multiple Vitamin (MULTIVITAMIN WITH MINERALS) TABS tablet Take 1 tablet by mouth daily.    Marland Kitchen oxyCODONE-acetaminophen (ROXICET) 5-325 MG tablet Take 1-2 tablets by mouth every 4 (four) hours as needed for severe pain. 50 tablet 0  . sulfamethoxazole-trimethoprim (BACTRIM DS,SEPTRA DS) 800-160 MG tablet Take 1 tablet by mouth 2 (two) times daily. 14 tablet 0  . tamoxifen (NOLVADEX) 20 MG tablet Take 1 tablet (20 mg total) by mouth daily. 30 tablet 2   No current facility-administered medications for this visit.    PHYSICAL EXAMINATION: ECOG PERFORMANCE STATUS: 1 - Symptomatic but completely ambulatory  Filed Vitals:   07/09/15 1411  BP: 118/68  Pulse: 83  Temp: 98.7 F (37.1 C)   Filed Weights   07/09/15 1411  Weight: 120 lb 1.6 oz (54.477 kg)    GENERAL:alert, no distress and comfortable SKIN: skin color, texture, turgor are normal, no rashes or significant lesions EYES: normal, Conjunctiva are pink and non-injected, sclera clear OROPHARYNX:no exudate, no erythema and lips, buccal mucosa, and tongue normal  NECK: supple, thyroid normal size, non-tender, without nodularity LYMPH:  no palpable lymphadenopathy in the cervical, axillary or inguinal LUNGS: clear to auscultation and percussion with normal breathing effort HEART: regular rate & rhythm and no murmurs and no lower extremity edema ABDOMEN:abdomen soft, non-tender and normal bowel sounds MUSCULOSKELETAL:no cyanosis of digits and no clubbing  NEURO: alert & oriented x 3 with fluent speech, no focal motor/sensory deficits EXTREMITIES: No lower extremity edema  LABORATORY DATA:  I have reviewed the data as listed    Chemistry      Component Value Date/Time   NA 140 05/11/2015 1441   K 4.8 05/11/2015 1441   CO2 26 05/11/2015 1441   BUN 12.9 05/11/2015 1441   CREATININE 0.8 05/11/2015 1441      Component Value Date/Time   CALCIUM 9.7 05/11/2015 1441   ALKPHOS 48 05/11/2015 1441   AST 13 05/11/2015 1441   ALT 10 05/11/2015 1441   BILITOT <0.30 05/11/2015 1441       Lab Results  Component Value Date   WBC 11.5* 05/11/2015   HGB 12.2 05/11/2015   HCT 37.1 05/11/2015   MCV 85.9 05/11/2015   PLT 287 05/11/2015   NEUTROABS 7.9* 05/11/2015     ASSESSMENT & PLAN:  Breast cancer of lower-outer quadrant of left female breast ([place]) Left breast mastectomy 03/09/2015 showed grade 1 invasive ductal carcinoma, 4 cm, 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 Pathologic staging: T2 N0 stage II a Oncotype DX score: 9, low risk, 6% risk of recurrence Genetic testing: No mutations identified, VUS PMS-2 Breast reconstruction  -------------------------------------------------------------------------------------------------- Current treatment: Tamoxifen 20 mg daily started March 2017 Patient was counseled about clinical trial P[facility]AS She was interested in the trial and was provided with a consent form.  Tamoxifen toxicities: 1. Occasional hot flashes 2. Intermittent joint aches  Weight gain: Patient is very concerned about her weight gain.  I discussed with her about the role of intermittent fasting diet. We also discussed the role of vitamin D which she is currently taking at 4000 international units.  Return to clinic in 3 months for follow-up or sooner if she goes on the clinical trial  No orders of the defined types were placed in this encounter.   The patient has a good understanding of the overall plan. she agrees with it. she will call with any problems that may develop before the next visit  here.   Rulon Eisenmenger, MD 07/09/2015

## 2015-07-21 ENCOUNTER — Other Ambulatory Visit: Payer: Self-pay | Admitting: Medical Oncology

## 2015-07-21 DIAGNOSIS — C50512 Malignant neoplasm of lower-outer quadrant of left female breast: Secondary | ICD-10-CM

## 2015-07-22 ENCOUNTER — Encounter: Payer: Federal, State, Local not specified - PPO | Admitting: Medical Oncology

## 2015-07-22 ENCOUNTER — Other Ambulatory Visit (HOSPITAL_BASED_OUTPATIENT_CLINIC_OR_DEPARTMENT_OTHER): Payer: Federal, State, Local not specified - PPO

## 2015-07-22 DIAGNOSIS — C50512 Malignant neoplasm of lower-outer quadrant of left female breast: Secondary | ICD-10-CM | POA: Diagnosis not present

## 2015-07-22 LAB — CBC WITH DIFFERENTIAL/PLATELET
BASO%: 0.8 % (ref 0.0–2.0)
BASOS ABS: 0.1 10*3/uL (ref 0.0–0.1)
EOS ABS: 0.2 10*3/uL (ref 0.0–0.5)
EOS%: 2 % (ref 0.0–7.0)
HEMATOCRIT: 38.2 % (ref 34.8–46.6)
HEMOGLOBIN: 12.9 g/dL (ref 11.6–15.9)
LYMPH%: 28.8 % (ref 14.0–49.7)
MCH: 29.1 pg (ref 25.1–34.0)
MCHC: 33.8 g/dL (ref 31.5–36.0)
MCV: 86.2 fL (ref 79.5–101.0)
MONO#: 0.7 10*3/uL (ref 0.1–0.9)
MONO%: 7.9 % (ref 0.0–14.0)
NEUT#: 5.5 10*3/uL (ref 1.5–6.5)
NEUT%: 60.5 % (ref 38.4–76.8)
Platelets: 210 10*3/uL (ref 145–400)
RBC: 4.43 10*6/uL (ref 3.70–5.45)
RDW: 13.7 % (ref 11.2–14.5)
WBC: 9 10*3/uL (ref 3.9–10.3)
lymph#: 2.6 10*3/uL (ref 0.9–3.3)

## 2015-07-22 LAB — COMPREHENSIVE METABOLIC PANEL
ALBUMIN: 4 g/dL (ref 3.5–5.0)
ALK PHOS: 39 U/L — AB (ref 40–150)
ALT: 10 U/L (ref 0–55)
AST: 17 U/L (ref 5–34)
Anion Gap: 6 mEq/L (ref 3–11)
BUN: 12.7 mg/dL (ref 7.0–26.0)
CALCIUM: 10 mg/dL (ref 8.4–10.4)
CO2: 28 mEq/L (ref 22–29)
Chloride: 108 mEq/L (ref 98–109)
Creatinine: 0.8 mg/dL (ref 0.6–1.1)
EGFR: 89 mL/min/{1.73_m2} — AB (ref >90)
Glucose: 97 mg/dl (ref 70–140)
POTASSIUM: 4.8 meq/L (ref 3.5–5.1)
SODIUM: 142 meq/L (ref 136–145)
Total Bilirubin: <0.3 mg/dL (ref 0.20–1.20)
Total Protein: 7.2 g/dL (ref 6.4–8.3)

## 2015-07-22 LAB — RESEARCH LABS

## 2015-07-23 LAB — HEMOGLOBIN A1C
Est. average glucose Bld gHb Est-mCnc: 117 mg/dL
HEMOGLOBIN A1C: 5.7 % — AB (ref 4.8–5.6)

## 2015-07-25 ENCOUNTER — Other Ambulatory Visit: Payer: Self-pay | Admitting: Hematology

## 2015-07-28 ENCOUNTER — Encounter: Payer: Federal, State, Local not specified - PPO | Admitting: Nurse Practitioner

## 2015-07-29 ENCOUNTER — Telehealth: Payer: Self-pay | Admitting: Hematology and Oncology

## 2015-07-29 ENCOUNTER — Other Ambulatory Visit: Payer: Self-pay

## 2015-07-29 DIAGNOSIS — C50512 Malignant neoplasm of lower-outer quadrant of left female breast: Secondary | ICD-10-CM

## 2015-07-29 MED ORDER — TAMOXIFEN CITRATE 20 MG PO TABS: 20.0000 mg | ORAL_TABLET | Freq: Every day | ORAL | Status: DC

## 2015-07-29 NOTE — Telephone Encounter (Signed)
Called to confirm appointment. Left message. Appointment letter and schedule mailed. Merleen Nicely.

## 2015-07-31 ENCOUNTER — Telehealth: Payer: Self-pay | Admitting: Hematology and Oncology

## 2015-07-31 ENCOUNTER — Encounter: Payer: Self-pay | Admitting: Adult Health

## 2015-07-31 ENCOUNTER — Other Ambulatory Visit: Payer: Self-pay | Admitting: Medical Oncology

## 2015-07-31 DIAGNOSIS — C50512 Malignant neoplasm of lower-outer quadrant of left female breast: Secondary | ICD-10-CM

## 2015-07-31 NOTE — Telephone Encounter (Signed)
Called & Spoke w/ pt confirmed 7/31 apt times

## 2015-08-03 ENCOUNTER — Encounter: Payer: Federal, State, Local not specified - PPO | Admitting: Medical Oncology

## 2015-08-03 ENCOUNTER — Ambulatory Visit (HOSPITAL_BASED_OUTPATIENT_CLINIC_OR_DEPARTMENT_OTHER): Payer: Federal, State, Local not specified - PPO

## 2015-08-03 DIAGNOSIS — Z006 Encounter for examination for normal comparison and control in clinical research program: Secondary | ICD-10-CM

## 2015-08-03 DIAGNOSIS — C50512 Malignant neoplasm of lower-outer quadrant of left female breast: Secondary | ICD-10-CM

## 2015-08-04 LAB — FOLLICLE STIMULATING HORMONE: FSH: 5.3 m[IU]/mL

## 2015-08-07 ENCOUNTER — Other Ambulatory Visit: Payer: Self-pay | Admitting: Medical Oncology

## 2015-08-07 DIAGNOSIS — C50512 Malignant neoplasm of lower-outer quadrant of left female breast: Secondary | ICD-10-CM

## 2015-08-10 ENCOUNTER — Ambulatory Visit (HOSPITAL_BASED_OUTPATIENT_CLINIC_OR_DEPARTMENT_OTHER): Payer: Federal, State, Local not specified - PPO | Admitting: Hematology and Oncology

## 2015-08-10 ENCOUNTER — Encounter: Payer: Self-pay | Admitting: Hematology and Oncology

## 2015-08-10 ENCOUNTER — Other Ambulatory Visit (HOSPITAL_BASED_OUTPATIENT_CLINIC_OR_DEPARTMENT_OTHER): Payer: Federal, State, Local not specified - PPO

## 2015-08-10 DIAGNOSIS — C50512 Malignant neoplasm of lower-outer quadrant of left female breast: Secondary | ICD-10-CM

## 2015-08-10 DIAGNOSIS — Z006 Encounter for examination for normal comparison and control in clinical research program: Secondary | ICD-10-CM | POA: Diagnosis not present

## 2015-08-10 LAB — CBC WITH DIFFERENTIAL/PLATELET
BASO%: 0.4 % (ref 0.0–2.0)
Basophils Absolute: 0 10*3/uL (ref 0.0–0.1)
EOS%: 1.4 % (ref 0.0–7.0)
Eosinophils Absolute: 0.2 10*3/uL (ref 0.0–0.5)
HEMATOCRIT: 36.7 % (ref 34.8–46.6)
HGB: 12.9 g/dL (ref 11.6–15.9)
LYMPH#: 2.7 10*3/uL (ref 0.9–3.3)
LYMPH%: 25.4 % (ref 14.0–49.7)
MCH: 29.5 pg (ref 25.1–34.0)
MCHC: 35.1 g/dL (ref 31.5–36.0)
MCV: 84 fL (ref 79.5–101.0)
MONO#: 0.6 10*3/uL (ref 0.1–0.9)
MONO%: 5.8 % (ref 0.0–14.0)
NEUT%: 67 % (ref 38.4–76.8)
NEUTROS ABS: 7 10*3/uL — AB (ref 1.5–6.5)
PLATELETS: 214 10*3/uL (ref 145–400)
RBC: 4.37 10*6/uL (ref 3.70–5.45)
RDW: 13.4 % (ref 11.2–14.5)
WBC: 10.5 10*3/uL — AB (ref 3.9–10.3)

## 2015-08-10 LAB — COMPREHENSIVE METABOLIC PANEL
ALT: 11 U/L (ref 0–55)
ANION GAP: 9 meq/L (ref 3–11)
AST: 16 U/L (ref 5–34)
Albumin: 4.2 g/dL (ref 3.5–5.0)
Alkaline Phosphatase: 42 U/L (ref 40–150)
BILIRUBIN TOTAL: 0.3 mg/dL (ref 0.20–1.20)
BUN: 12.2 mg/dL (ref 7.0–26.0)
CALCIUM: 10 mg/dL (ref 8.4–10.4)
CHLORIDE: 107 meq/L (ref 98–109)
CO2: 24 meq/L (ref 22–29)
CREATININE: 0.8 mg/dL (ref 0.6–1.1)
EGFR: 85 mL/min/{1.73_m2} — ABNORMAL LOW (ref >90)
Glucose: 95 mg/dl (ref 70–140)
Potassium: 4.9 mEq/L (ref 3.5–5.1)
Sodium: 140 mEq/L (ref 136–145)
TOTAL PROTEIN: 7.6 g/dL (ref 6.4–8.3)

## 2015-08-10 NOTE — Progress Notes (Signed)
[place] NOTE  Patient Care Team: Curly Rim, MD as PCP - General (Family Medicine) Truitt Merle, MD as Consulting Physician (Hematology) Autumn Messing III, MD as Consulting Physician (General Surgery)  CHIEF COMPLAINTS:  History and physical for participation in clinical trial PALLAS  HISTORY OF PRESENTING ILLNESS:  Joann Collins 50 y.o. female is here because of diagnosis of left breast cancer based on mammogram in 01/27/2015. 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 She subsequently had a mastectomy with tissue expander implantation. 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 Oncotype DX recurrence score of 9 low risk and did not need chemotherapy. Left breast implant was placed 06/06/2015. She started taking tamoxifen March 2017 and appears to be tolerating it extremely well. She is here to initiate clinical trial PALLAS which randomizes patients to standard therapy versus standard therapy with Ibrance.  I reviewed her records extensively and collaborated the history with the patient.  SUMMARY OF ONCOLOGIC HISTORY: Oncology History   Breast cancer of lower-outer quadrant of left female breast ([place])   Staging form: Breast, AJCC 7th Edition     Clinical: Stage IIB (T3, N0, M0) - Unsigned     Pathologic stage from 03/09/2015: Stage IIA (T2, N0, cM0) - Signed by Truitt Merle, MD on 03/30/2015       Breast cancer of lower-outer quadrant of left female breast ([place])   01/27/2015 Mammogram    diagnostic mammogram and ultrasound showed architectural distortion within the left outer breast, 3:00 position. Area of biopsied architectural distortion measures approx 2.2 x 1.8 x 1.4 cm.      01/29/2015 Initial Biopsy    (L) breast mass biopsy showed low-grade invasive ductal carcinoma & DCIS     01/29/2015 Receptors her2    ER 95% positive, PR 80% positive, HER-2 negative  (ratio 1.22), Ki-67 3%.     02/06/2015 Breast MRI    Non-masslike enhancement measuring 1.4 x 6.3 x 3.9 cm at the left breast 3 to 5:00 position. Enhancement extends to skin laterally with mild enhancement of adjacent skin, tumor extension in skin not excluded. No adenopathy. Bilat implants noted      03/09/2015 Surgery    (L) breast simple mastectomy with sentinel lymph node biopsy,  with tissue expander placement Marlou Starks)     03/09/2015 Pathology Results    (L) breast mastectomy showed grade 1 invasive ductal carcinoma, 4 cm, extensive DCIS with associated calcs, grade 2, surgical margins were negative, 4 sentinel lymph nodes were negative.     03/09/2015 Oncotype testing    RS 9, which predicts a distant recurrence risk of 6% with tamoxifen alone (low risk)     03/09/2015 Pathologic Stage    pT2, pN0: Stage IIA     03/25/2015 -  Anti-estrogen oral therapy    Tamoxifen 20 mg daily     05/11/2015 Miscellaneous    Genetic testing. 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 Genes evaluated: ATM, BARD1, BRCA1, BRCA2, BRIP1, CDH1, CHEK2, EPCAM, FANCC, MLH1, MSH2, MSH6, NBN, PALB2, PMS2, PTEN, RAD51C, RAD51D, TP53, & XRCC2.      06/06/2015 Surgery    Removal of left breast tissue expander and placement of saline implant ([place])      MEDICAL HISTORY:  Past Medical History:  Diagnosis Date  . Cancer [facility])    left breast cancer  . Family history of breast cancer   .  Family history of pancreatic cancer   . Spinal headache    with second c-section    SURGICAL HISTORY: Past Surgical History:  Procedure Laterality Date  . ABDOMINAL HYSTERECTOMY    . BREAST ENHANCEMENT SURGERY     2000  . BREAST RECONSTRUCTION WITH PLACEMENT OF TISSUE EXPANDER AND FLEX HD (ACELLULAR HYDRATED DERMIS) Left 03/09/2015   Procedure: LEFT BREAST RECONSTRUCTION WITH PLACEMENT OF TISSUE EXPANDER AND POSSIBLE ACELLULAR DERMIS;  Surgeon: Irene Limbo, MD;  Location: [place];  Service: Plastics;  Laterality: Left;  . BREAST  SURGERY     biopsy  . CESAREAN SECTION     x 2  . LIPOSUCTION WITH LIPOFILLING Left 06/05/2015   Procedure: LIPOSUCTION WITH LIPOFILLING TO LEFT CHEST;  Surgeon: Irene Limbo, MD;  Location: [place];  Service: Plastics;  Laterality: Left;  Marland Kitchen MASTECTOMY W/ SENTINEL NODE BIOPSY Left 03/09/2015   Procedure: LEFT MASTECTOMY WITH SENTINEL LYMPH NODE MAPPING;  Surgeon: Autumn Messing III, MD;  Location: [place];  Service: General;  Laterality: Left;  . REMOVAL OF TISSUE EXPANDER AND PLACEMENT OF IMPLANT Left 06/05/2015   Procedure: REMOVAL OF LEFT TISSUE EXPANDER AND PLACEMENT OF SALINE IMPLANT;  Surgeon: Irene Limbo, MD;  Location: [place];  Service: Plastics;  Laterality: Left;  . TONSILLECTOMY    . WISDOM TOOTH EXTRACTION      SOCIAL HISTORY: Social History   Social History  . Marital status: Married    Spouse name: N/A  . Number of children: 2  . Years of education: N/A   Occupational History  . Not on file.   Social History Main Topics  . Smoking status: Former Smoker    Packs/day: 1.00    Years: 30.00    Types: Cigarettes    Quit date: 02/02/2015  . Smokeless tobacco: Never Used  . Alcohol use No  . Drug use: No  . Sexual activity: Yes    Birth control/ protection: Surgical   Other Topics Concern  . Not on file   Social History Narrative  . No narrative on file    FAMILY HISTORY: Family History  Problem Relation Age of Onset  . Breast cancer  60  . Breast cancer Maternal Aunt 104  . Breast cancer Maternal Aunt 83  . Heart disease Other   . Heart disease Father   . Heart disease Maternal Uncle   . Pancreatic cancer Paternal Aunt 83  . Heart disease Paternal Uncle   . Alzheimer's disease Maternal Grandmother   . Heart disease Maternal Grandfather   . Heart disease Paternal Grandmother   . Heart disease Paternal Grandfather   . Breast cancer Cousin 37  . Breast cancer Cousin 74    Maternal cousin's daughter  . Prostate  cancer Cousin     Maternal first cousin  . Ovarian cancer Other     Mother's maternal grandmother    ALLERGIES:  is allergic to penicillins.  MEDICATIONS:  Current Outpatient Prescriptions  Medication Sig Dispense Refill  . acyclovir (ZOVIRAX) 800 MG tablet Take 800 mg by mouth daily.    . methocarbamol (ROBAXIN) 500 MG tablet Take 1 tablet (500 mg total) by mouth every 8 (eight) hours as needed for muscle spasms. 30 tablet 0  . Multiple Vitamin (MULTIVITAMIN WITH MINERALS) TABS tablet Take 1 tablet by mouth daily.    Marland Kitchen oxyCODONE-acetaminophen (ROXICET) 5-325 MG tablet Take 1-2 tablets by mouth every 4 (four) hours as needed for severe pain. 50 tablet 0  .  sulfamethoxazole-trimethoprim (BACTRIM DS,SEPTRA DS) 800-160 MG tablet Take 1 tablet by mouth 2 (two) times daily. 14 tablet 0  . tamoxifen (NOLVADEX) 20 MG tablet Take 1 tablet (20 mg total) by mouth daily. 30 tablet 2   No current facility-administered medications for this visit.     REVIEW OF SYSTEMS:   Constitutional: Denies fevers, chills or abnormal night sweats Eyes: Denies blurriness of vision, double vision or watery eyes Ears, nose, mouth, throat, and face: Denies mucositis or sore throat Respiratory: Denies cough, dyspnea or wheezes Cardiovascular: Denies palpitation, chest discomfort or lower extremity swelling Gastrointestinal:  Denies nausea, heartburn or change in bowel habits Skin: Denies abnormal skin rashes Lymphatics: Denies new lymphadenopathy or easy bruising Neurological:Denies numbness, tingling or new weaknesses Behavioral/Psych: Mood is stable, no new changes  Breast:  Denies any palpable lumps or discharge All other systems were reviewed with the patient and are negative.  PHYSICAL EXAMINATION: ECOG PERFORMANCE STATUS: 0 - Asymptomatic  Vitals:   08/10/15 1356  BP: 120/81  Pulse: 81  Resp: 18  Temp: 99.1 F (37.3 C)   Filed Weights   08/10/15 1356  Weight: 121 lb 9.6 oz (55.2 kg)     GENERAL:alert, no distress and comfortable SKIN: skin color, texture, turgor are normal, no rashes or significant lesions EYES: normal, conjunctiva are pink and non-injected, sclera clear OROPHARYNX:no exudate, no erythema and lips, buccal mucosa, and tongue normal  NECK: supple, thyroid normal size, non-tender, without nodularity LYMPH:  no palpable lymphadenopathy in the cervical, axillary or inguinal LUNGS: clear to auscultation and percussion with normal breathing effort HEART: regular rate & rhythm and no murmurs and no lower extremity edema ABDOMEN:abdomen soft, non-tender and normal bowel sounds Musculoskeletal:no cyanosis of digits and no clubbing  PSYCH: alert & oriented x 3 with fluent speech NEURO: no focal motor/sensory deficits BREAST: No palpable nodules in breast. Breast appears to have healed very well. No palpable axillary or supraclavicular lymphadenopathy (exam performed in the presence of a chaperone)   LABORATORY DATA:  I have reviewed the data as listed Lab Results  Component Value Date   WBC 10.5 (H) 08/10/2015   HGB 12.9 08/10/2015   HCT 36.7 08/10/2015   MCV 84.0 08/10/2015   PLT 214 08/10/2015   Lab Results  Component Value Date   NA 142 07/22/2015   K 4.8 07/22/2015   CO2 28 07/22/2015    RADIOGRAPHIC STUDIES: I have personally reviewed the radiological reports and agreed with the findings in the report.  ASSESSMENT AND PLAN:  Breast cancer of lower-outer quadrant of left female breast ([place]) Left breast mastectomy 03/09/2015 showed grade 1 invasive ductal carcinoma, 4 cm, 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 Pathologic staging: T2 N0 stage II a Oncotype DX score: 9, low risk, 6% risk of recurrence Genetic testing: No mutations identified, VUS PMS-2 Breast reconstruction   -------------------------------------------------------------------------------------------------- Current treatment: Tamoxifen 20 mg daily started March 2017 Patient was counseled about clinical trial PALLAS  Patient does not have any residual side effects from prior surgery and breast reconstruction, no uncontrolled intercurrent illnesses of any kind related to infection, heart disease, diabetes, psychiatric illnesses of any kind. Patient has read the study in great detail and understood the implications of the study and is agreeable and consented to participation.  PALLAS clinical trial counseling: Patients who have completed definitive therapy for breast cancer are randomized to antiestrogen therapy (5+ years) versus antiestrogen therapy plus  Palbociclib (2 years). Palbociclib: If she was randomized to Palbociclib, I discussed the risks and benefits of Ibrance including myelosuppression especially neutropenia and with that risk of infection, there is risk of pulmonary embolism and mild peripheral neuropathy as well. Fatigue, nausea, diarrhea, decreased appetite as well as alopecia and thrombocytopenia are also potential side effects of Palbociclib.  The patient is premenopausal based upon a FSH of 5.3  Tamoxifen toxicities: 1. Occasional hot flashes 2. Intermittent joint aches  Weight gain: I discussed with her the importance of physical exercise and reducing high carbohydrate foods and eating more vegetables and fruits than red meats.  All questions were answered. The patient knows to call the clinic with any problems, questions or concerns.    Rulon Eisenmenger, MD 08/10/15

## 2015-08-10 NOTE — Assessment & Plan Note (Signed)
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 Pathologic staging: T2 N0 stage II a Oncotype DX score: 9, low risk, 6% risk of recurrence Genetic testing: No mutations identified, VUS PMS-2 Breast reconstruction  -------------------------------------------------------------------------------------------------- Current treatment: Tamoxifen 20 mg daily started March 2017 Patient was counseled about clinical trial PALLAS  Tamoxifen toxicities: 1. Occasional hot flashes 2. Intermittent joint aches  Weight gain: I discussed with Joann Collins the importance of physical exercise and reducing high carbohydrate foods and eating more vegetables and fruits than red meats.

## 2015-08-12 ENCOUNTER — Other Ambulatory Visit: Payer: Self-pay | Admitting: Hematology and Oncology

## 2015-08-12 ENCOUNTER — Other Ambulatory Visit: Payer: Self-pay | Admitting: Medical Oncology

## 2015-08-12 DIAGNOSIS — C50512 Malignant neoplasm of lower-outer quadrant of left female breast: Secondary | ICD-10-CM

## 2015-08-13 LAB — ESTRADIOL, ULTRA SENS: ESTRADIOL, SENSITIVE: 771.3 pg/mL

## 2015-08-17 ENCOUNTER — Other Ambulatory Visit: Payer: Federal, State, Local not specified - PPO

## 2015-08-17 ENCOUNTER — Encounter: Payer: Federal, State, Local not specified - PPO | Admitting: *Deleted

## 2015-08-17 DIAGNOSIS — C50512 Malignant neoplasm of lower-outer quadrant of left female breast: Secondary | ICD-10-CM

## 2015-08-17 LAB — RESEARCH LABS

## 2015-08-17 MED ORDER — INV-PALBOCICLIB 125 MG CAPS #23 ALLIANCE FOUNDATION AFT-05 (PALLAS): 125.0000 mg | ORAL_CAPSULE | Freq: Every day | ORAL | 0 refills | Status: DC

## 2015-08-17 NOTE — Progress Notes (Signed)
08/17/2015 Patient in to clinic today for C1D1 visit. Upon arrival to clinic, PRO questionnaires were completed independently by the patient. Patient then proceeded to lab for collection of C1D1 research blood samples. Study medication bottle with Palbociclib 125 mg capsules, kit #[id number], was dispensed by pharmacist Carol Jones, and was reviewed with the patient, noting that medication is taken for the first 21 days of the 4 week cycle, beginning today, and should be taken at approximately the same time each day with food. Patient understands that tamoxifen is taken daily without interruption. She reports that she takes her tamoxifen in the morning around 9am. Patient was given diaries for recording anti-hormone therapy (tamoxifen) and palbociclib doses, noting the time and number of pills taken, and instructions for the investigational medication were reviewed with her. Confirmed that patient is aware that she should avoid grapefruit and grapefruit juice during the 2-year study period to avoid medication interactions. Discussed that if patient wants to take study medication in the morning, that the doses should be gradually moved back from evening dosing (with study treatment beginning today), however, patient states she will just take the palbociclib in the evening. Instructed patient to return all study medication bottles at her next cycle visit in four weeks, including any unused capsules, if any are remaining. Stressed that patient should not take any doses beyond Day 21 of the cycle (09/06/2015). Patient is aware that she will return to the clinic in two weeks for lab tests, and that she will make visits to the clinic every two weeks for the first eight weeks on the study. Patient was instructed to notify the office of any questions or concerns, or if new symptoms develop. She is to contact the main Cancer [facility] number [phone], where clinicians can be reached 24/7. Thanked patient for her participation in  the study. Cindy S.  BSN, RN, CCRP 08/17/2015 2:49 PM 

## 2015-08-19 ENCOUNTER — Encounter: Payer: Self-pay | Admitting: Hematology and Oncology

## 2015-08-20 ENCOUNTER — Telehealth: Payer: Self-pay

## 2015-08-20 NOTE — Telephone Encounter (Signed)
Received message from pt questioning lab results from 08/03/15.  Specific lab in question is elevated Estradiol level and specific phase ranges given with results.  Called pt to discuss.  I explained to the patient that with this test we are simply looking to see if patient is in menopause.  Pt questioning why her Estradiol level is out of phase ranges.  I explained to pt that phases are looked at for fertility.  Pt verbalized understanding as she already knew she was not in menopause and is without further questions or concerns at time of call.

## 2015-08-28 ENCOUNTER — Telehealth: Payer: Self-pay | Admitting: Medical Oncology

## 2015-08-28 ENCOUNTER — Telehealth: Payer: Self-pay

## 2015-08-28 ENCOUNTER — Other Ambulatory Visit: Payer: Self-pay | Admitting: Medical Oncology

## 2015-08-28 DIAGNOSIS — C50512 Malignant neoplasm of lower-outer quadrant of left female breast: Secondary | ICD-10-CM

## 2015-08-28 NOTE — Telephone Encounter (Signed)
PALLAS Patient called requesting to have lab appointment changed on Monday 8/21, to 3:30. Informed patient we will make the change for her. Also I inquired with patient as to how she was doing/feeling and if she was having any difficulty with taking the palbociclib or has noticed any side effects, patient states she has not had any issues or concerns relating to study drug. Patient does state that she is concerned about her weight gain and the fact that she has been putting weight on despite her exercising and watching what she is eating. Patient has had this concern prior to start of study drug and has discussed her concern with Dr. Lindi Adie. Informed patient that we should review this again with Dr. Lindi Adie at her next appointment. All patient's questions answered, she denies further questions at this time. I thanked patient for her time and encouraged her to contact me with any questions or concern she may have.  New appt made for 3:30 8/21, as requested by patient.  Adele Dan, RN, BSN Clinical Research 08/28/2015 3:48 PM

## 2015-08-28 NOTE — Telephone Encounter (Signed)
Per Rubin Payor she has spoken with the patient and she request that her lab appointment on 8/21 to be moved to 3:30,done and per [facility] patient does not need to be called.

## 2015-08-31 ENCOUNTER — Other Ambulatory Visit: Payer: Federal, State, Local not specified - PPO

## 2015-08-31 ENCOUNTER — Other Ambulatory Visit (HOSPITAL_BASED_OUTPATIENT_CLINIC_OR_DEPARTMENT_OTHER): Payer: Federal, State, Local not specified - PPO

## 2015-08-31 ENCOUNTER — Telehealth: Payer: Self-pay | Admitting: Medical Oncology

## 2015-08-31 DIAGNOSIS — C50512 Malignant neoplasm of lower-outer quadrant of left female breast: Secondary | ICD-10-CM

## 2015-08-31 DIAGNOSIS — Z006 Encounter for examination for normal comparison and control in clinical research program: Secondary | ICD-10-CM | POA: Diagnosis not present

## 2015-08-31 LAB — CBC WITH DIFFERENTIAL/PLATELET
BASO%: 0.9 % (ref 0.0–2.0)
BASOS ABS: 0.1 10*3/uL (ref 0.0–0.1)
EOS%: 1.5 % (ref 0.0–7.0)
Eosinophils Absolute: 0.1 10*3/uL (ref 0.0–0.5)
HEMATOCRIT: 34.7 % — AB (ref 34.8–46.6)
HGB: 11.6 g/dL (ref 11.6–15.9)
LYMPH%: 38.6 % (ref 14.0–49.7)
MCH: 29.1 pg (ref 25.1–34.0)
MCHC: 33.4 g/dL (ref 31.5–36.0)
MCV: 87.2 fL (ref 79.5–101.0)
MONO#: 0.4 10*3/uL (ref 0.1–0.9)
MONO%: 5.6 % (ref 0.0–14.0)
NEUT#: 3.5 10*3/uL (ref 1.5–6.5)
NEUT%: 53.4 % (ref 38.4–76.8)
Platelets: 218 10*3/uL (ref 145–400)
RBC: 3.98 10*6/uL (ref 3.70–5.45)
RDW: 13.6 % (ref 11.2–14.5)
WBC: 6.6 10*3/uL (ref 3.9–10.3)
lymph#: 2.6 10*3/uL (ref 0.9–3.3)

## 2015-08-31 NOTE — Telephone Encounter (Signed)
PALLAS: 3:40 PM- Met with patient today prior to her lab appointment for cycle 1 day 14. Patient reports to be feeling well, other than being slightly more fatigued. Patient denies any other side effects, issues or concerns. Informed patient will call her with lab results.  4:50 PM- Informed patient of her lab results. Patient's lab resulted within normal ranges and informed patient to continue with last week of palbociclib, stop at the date stated on her medication diaries, to give the 7 day break and then we will see her here on 9/5 for Cycle 2. Patient gave verbal understanding. All patient's questions answered to her satisfaction and patient encouraged to call Dr. Lindi Adie or myself with any questions or concerns.  Adele Dan, RN, BSN Clinical Research 08/31/2015 5:07 PM

## 2015-09-08 ENCOUNTER — Other Ambulatory Visit: Payer: Self-pay | Admitting: Medical Oncology

## 2015-09-08 DIAGNOSIS — C50512 Malignant neoplasm of lower-outer quadrant of left female breast: Secondary | ICD-10-CM

## 2015-09-11 ENCOUNTER — Other Ambulatory Visit: Payer: Federal, State, Local not specified - PPO

## 2015-09-11 ENCOUNTER — Ambulatory Visit: Payer: Federal, State, Local not specified - PPO | Admitting: Hematology

## 2015-09-15 ENCOUNTER — Encounter: Payer: Self-pay | Admitting: Medical Oncology

## 2015-09-15 ENCOUNTER — Telehealth: Payer: Self-pay | Admitting: Hematology and Oncology

## 2015-09-15 ENCOUNTER — Telehealth: Payer: Self-pay | Admitting: Medical Oncology

## 2015-09-15 ENCOUNTER — Encounter: Payer: Self-pay | Admitting: Hematology and Oncology

## 2015-09-15 ENCOUNTER — Ambulatory Visit (HOSPITAL_BASED_OUTPATIENT_CLINIC_OR_DEPARTMENT_OTHER): Payer: Federal, State, Local not specified - PPO | Admitting: Hematology and Oncology

## 2015-09-15 ENCOUNTER — Other Ambulatory Visit (HOSPITAL_BASED_OUTPATIENT_CLINIC_OR_DEPARTMENT_OTHER): Payer: Federal, State, Local not specified - PPO

## 2015-09-15 DIAGNOSIS — C50512 Malignant neoplasm of lower-outer quadrant of left female breast: Secondary | ICD-10-CM | POA: Diagnosis not present

## 2015-09-15 DIAGNOSIS — N200 Calculus of kidney: Secondary | ICD-10-CM | POA: Diagnosis not present

## 2015-09-15 DIAGNOSIS — Z006 Encounter for examination for normal comparison and control in clinical research program: Secondary | ICD-10-CM

## 2015-09-15 DIAGNOSIS — Z170 Estrogen receptor positive status [ER+]: Secondary | ICD-10-CM

## 2015-09-15 DIAGNOSIS — Z79810 Long term (current) use of selective estrogen receptor modulators (SERMs): Secondary | ICD-10-CM

## 2015-09-15 LAB — CBC WITH DIFFERENTIAL/PLATELET
BASO%: 1.4 % (ref 0.0–2.0)
Basophils Absolute: 0.1 10*3/uL (ref 0.0–0.1)
EOS%: 0.6 % (ref 0.0–7.0)
Eosinophils Absolute: 0 10*3/uL (ref 0.0–0.5)
HEMATOCRIT: 33.6 % — AB (ref 34.8–46.6)
HGB: 11.2 g/dL — ABNORMAL LOW (ref 11.6–15.9)
LYMPH#: 1.4 10*3/uL (ref 0.9–3.3)
LYMPH%: 33.9 % (ref 14.0–49.7)
MCH: 29.9 pg (ref 25.1–34.0)
MCHC: 33.3 g/dL (ref 31.5–36.0)
MCV: 89.8 fL (ref 79.5–101.0)
MONO#: 0.4 10*3/uL (ref 0.1–0.9)
MONO%: 9.5 % (ref 0.0–14.0)
NEUT%: 54.6 % (ref 38.4–76.8)
NEUTROS ABS: 2.3 10*3/uL (ref 1.5–6.5)
PLATELETS: 242 10*3/uL (ref 145–400)
RBC: 3.75 10*6/uL (ref 3.70–5.45)
RDW: 15.9 % — AB (ref 11.2–14.5)
WBC: 4.3 10*3/uL (ref 3.9–10.3)

## 2015-09-15 LAB — COMPREHENSIVE METABOLIC PANEL
ALK PHOS: 35 U/L — AB (ref 40–150)
ALT: 9 U/L (ref 0–55)
ANION GAP: 10 meq/L (ref 3–11)
AST: 16 U/L (ref 5–34)
Albumin: 4 g/dL (ref 3.5–5.0)
BUN: 8.4 mg/dL (ref 7.0–26.0)
CALCIUM: 9.4 mg/dL (ref 8.4–10.4)
CHLORIDE: 110 meq/L — AB (ref 98–109)
CO2: 23 mEq/L (ref 22–29)
Creatinine: 0.8 mg/dL (ref 0.6–1.1)
GLUCOSE: 99 mg/dL (ref 70–140)
POTASSIUM: 4.2 meq/L (ref 3.5–5.1)
Sodium: 143 mEq/L (ref 136–145)
TOTAL PROTEIN: 7.1 g/dL (ref 6.4–8.3)
Total Bilirubin: 0.32 mg/dL (ref 0.20–1.20)

## 2015-09-15 MED ORDER — INV-PALBOCICLIB 125 MG CAPS #23 ALLIANCE FOUNDATION AFT-05 (PALLAS): 125.0000 mg | ORAL_CAPSULE | Freq: Every day | ORAL | 0 refills | Status: DC

## 2015-09-15 MED ORDER — CHOLECALCIFEROL 100 MCG (4000 UT) PO TABS: 1000.0000 [IU] | ORAL_TABLET | Freq: Every day | ORAL | Status: DC

## 2015-09-15 MED ORDER — CYANOCOBALAMIN 1000 MCG/15ML PO LIQD: ORAL | Status: AC

## 2015-09-15 NOTE — Assessment & Plan Note (Signed)
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 Pathologic staging: T2 N0 stage II a Oncotype DX score: 9, low risk, 6% risk of recurrence Genetic testing: No mutations identified, VUS PMS-2 Breast reconstruction  -------------------------------------------------------------------------------------------------- Current treatment: Tamoxifen 20 mg daily started March 2017 (premenopausal based on blood work showing [place] 5.3) Clinical trial: PALLAS and enroled 08/10/2015, patient randomized to [facility] Current dose: 125 mg daily  Tamoxifen toxicities: 1. Occasional hot flashes 2. Intermittent joint aches  Ibrance toxicities:  Weight gain: Patient was encouraged to participate in [facility] clinical trial for weight loss

## 2015-09-15 NOTE — Progress Notes (Signed)
PALLAS: Cycle 2 Day 1 Patient here today for labs and MD assessment prior to start of Cycle 2. Patient completed PRO's. Labs collected and V/S obtained. Patient returned cycle 1 dispensed palbociclib bottle # [id number], with one tablet remaining and this was given to Carolynne Edouard, PharmD, for documentation. Patient also provided me with her cycle 1 medication diary noting one missed day of palbociclib. Patient confirms no difficulty in self administration or documentation in diary. Patient is reporting a left flank pain upon arrival in which she states started yesterday (9/4) along with some nausea and rates pain a 4 on a scale of 10 being worst. Patient reports she believes it is a kidney stone, because she's had one in the past and this presents the same. Per Dr. Lindi Adie, will watch for now and patient to call should symptoms worsen, patient gave verbal understanding. Patient denies any vomiting, denies bowel or bladder issues and reports no other side effects. Patient confirms mild fatigue, that continues since prior to start of palbociclib as well as night sweats, originally reported as hot flashes, that also started prior to start of palbociclib. Patient confirms to have not had any hot flashes, previously reported. Patient also denies having any joint aches. Concomitant medications reviewed with patient and confirms no new medications. Patient would like to start taking a vitamin B-12 daily and per MD, ok to start, and patient reports will start B-12 today. Per Dr. Geralyn Flash assessment of patient and review of all labs as well as NCS, patient cleared for start of cycle 2. Study generated assignment of palbociclib provided to PharmD Jori Moll for dispensing and this nurse provided patient with bottle # [id number], with 21 tablets enclosed, as well as medication diary and instructions to start taking palbociclib tonight at her usual time. All patient's questions answered to her satisfaction, patient denies  further questions at this time and was encouraged to contact Dr. Lindi Adie or myself with any questions or concerns.  Patient to return in two weeks for cycle 2/day 14 labs- 09/28/15  Patient reconsented today with PALLAS' new ICF, Version 3.1, dated 09/04/15. Patient and I reviewed all the changes to the consent form together, page by page and patient initialed and dated each page as required and after all of patient's questions answered to her satisfaction, patient proceeded to sign and date where indicated in ICF. Patient was provided with her signed copy of Version 3.1, dated 09/04/15, consent form for her records.  Adele Dan, RN, BSN Clinical Research 09/15/2015 5:18 PM

## 2015-09-15 NOTE — Telephone Encounter (Signed)
PALLAS: LVMOM with patient. Patient scheduled for cycle 2 lab and MD appointment today at 1:15, and has not arrived. Call to patient to see if all is well or maybe patient forgot appointment. LVM asking patient to return call at earliest convenience.  Adele Dan, RN, BSN Clinical Research 09/15/2015 1:32 PM

## 2015-09-15 NOTE — Progress Notes (Addendum)
Patient Care Team: Curly Rim, MD as PCP - General (Family Medicine) Truitt Merle, MD as Consulting Physician (Hematology) Autumn Messing III, MD as Consulting Physician (General Surgery)  DIAGNOSIS: Breast cancer of lower-outer quadrant of left female breast [facility])   Staging form: Breast, AJCC 7th Edition   - Clinical: Stage IIB (T3, N0, M0) - Unsigned   - Pathologic stage from 03/09/2015: Stage IIA (T2, N0, cM0) - Signed by Truitt Merle, MD on 03/30/2015  SUMMARY OF ONCOLOGIC HISTORY: Oncology History   Breast cancer of lower-outer quadrant of left female breast ([place])   Staging form: Breast, AJCC 7th Edition     Clinical: Stage IIB (T3, N0, M0) - Unsigned     Pathologic stage from 03/09/2015: Stage IIA (T2, N0, cM0) - Signed by Truitt Merle, MD on 03/30/2015       Breast cancer of lower-outer quadrant of left female breast ([place])   01/27/2015 Mammogram    diagnostic mammogram and ultrasound showed architectural distortion within the left outer breast, 3:00 position. Area of biopsied architectural distortion measures approx 2.2 x 1.8 x 1.4 cm.       01/29/2015 Initial Biopsy    (L) breast mass biopsy showed low-grade invasive ductal carcinoma & DCIS      01/29/2015 Receptors her2    ER 95% positive, PR 80% positive, HER-2 negative (ratio 1.22), Ki-67 3%.      02/06/2015 Breast MRI    Non-masslike enhancement measuring 1.4 x 6.3 x 3.9 cm at the left breast 3 to 5:00 position. Enhancement extends to skin laterally with mild enhancement of adjacent skin, tumor extension in skin not excluded. No adenopathy. Bilat implants noted       03/09/2015 Surgery    (L) breast simple mastectomy with sentinel lymph node biopsy,  with tissue expander placement Marlou Starks)      03/09/2015 Pathology Results    (L) breast mastectomy showed grade 1 invasive ductal carcinoma, 4 cm, extensive DCIS with associated calcs, grade 2, surgical margins were negative, 4 sentinel lymph nodes were negative.      03/09/2015  Oncotype testing    RS 9, which predicts a distant recurrence risk of 6% with tamoxifen alone (low risk)      03/09/2015 Pathologic Stage    pT2, pN0: Stage IIA      03/25/2015 -  Anti-estrogen oral therapy    Tamoxifen 20 mg daily      05/11/2015 Miscellaneous    Genetic testing. 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 Genes evaluated: ATM, BARD1, BRCA1, BRCA2, BRIP1, CDH1, CHEK2, EPCAM, FANCC, MLH1, MSH2, MSH6, NBN, PALB2, PMS2, PTEN, RAD51C, RAD51D, TP53, & XRCC2.       06/06/2015 Surgery    Removal of left breast tissue expander and placement of saline implant (Thimmappa)       CHIEF COMPLIANT: Cycle 2 Ibrance on PALLAS clinical trial with letrozole  INTERVAL HISTORY: Joann Collins is a 50 year old with above-mentioned history of stage II breast cancer currently enrolled in PALLAS clinical trial. She was randomized to Ibrance in combination with tamoxifen. She had been tolerating the treatment extremely well. She had noticed mild fatigue but that has not changed since starting on Ibrance. She attributes this to tamoxifen. She denies any nausea vomiting. She was flying in from [place] and started having left flank pain. She is previously had 2 episodes of kidney stones. She definitely thinks this is the same pain as her prior kidney stone. She has been trying to drink lots of water to  help pass the stone.   REVIEW OF SYSTEMS:   Constitutional: Denies fevers, chills or abnormal weight loss, mild fatigue Eyes: Denies blurriness of vision Ears, nose, mouth, throat, and face: Denies mucositis or sore throat Respiratory: Denies cough, dyspnea or wheezes Cardiovascular: Denies palpitation, chest discomfort GastrointestinLeft flank pain probably due to kidney stone Skin: Denies abnormal skin rashes Lymphatics: Denies new lymphadenopathy or easy bruising Neurological:Denies numbness, tingling or new weaknesses Behavioral/Psych: Mood is stable, no new changes  Extremities: No lower  extremity edema Breast:  denies any pain or lumps or nodules in either breasts All other systems were reviewed with the patient and are negative.  I have reviewed the past medical history, past surgical history, social history and family history with the patient and they are unchanged from previous note.  ALLERGIES:  is allergic to penicillins.  MEDICATIONS:  Current Outpatient Prescriptions  Medication Sig Dispense Refill  . acyclovir (ZOVIRAX) 800 MG tablet Take 800 mg by mouth daily.    . Investigational palbociclib (IBRANCE) 125 MG capsule [facility] AFT-05 PALLAS Take 1 capsule (125 mg total) by mouth daily. Take with food. Swallow whole. Do not chew. Take on days 1-21. Repeat every 28 days. 23 capsule 0  . methocarbamol (ROBAXIN) 500 MG tablet Take 1 tablet (500 mg total) by mouth every 8 (eight) hours as needed for muscle spasms. 30 tablet 0  . Multiple Vitamin (MULTIVITAMIN WITH MINERALS) TABS tablet Take 1 tablet by mouth daily.    Marland Kitchen oxyCODONE-acetaminophen (ROXICET) 5-325 MG tablet Take 1-2 tablets by mouth every 4 (four) hours as needed for severe pain. 50 tablet 0  . sulfamethoxazole-trimethoprim (BACTRIM DS,SEPTRA DS) 800-160 MG tablet Take 1 tablet by mouth 2 (two) times daily. 14 tablet 0  . tamoxifen (NOLVADEX) 20 MG tablet Take 1 tablet (20 mg total) by mouth daily. 30 tablet 2   No current facility-administered medications for this visit.     PHYSICAL EXAMINATION: ECOG PERFORMANCE STATUS: 1 - Symptomatic but completely ambulatory  Vitals:   09/15/15 1405  BP: 107/78  Pulse: 72  Resp: 18  Temp: 98.5 F (36.9 C)   Filed Weights   09/15/15 1405  Weight: 126 lb 12.8 oz (57.5 kg)    GENERAL:alert, no distress and comfortable SKIN: skin color, texture, turgor are normal, no rashes or significant lesions EYES: normal, Conjunctiva are pink and non-injected, sclera clear OROPHARYNX:no exudate, no erythema and lips, buccal mucosa, and tongue normal  NECK:  supple, thyroid normal size, non-tender, without nodularity LYMPH:  no palpable lymphadenopathy in the cervical, axillary or inguinal LUNGS: clear to auscultation and percussion with normal breathing effort HEART: regular rate & rhythm and no murmurs and no lower extremity edema ABDOMEN:Right upper quadrant abdominal tenderness to deep palpation, also felt mildly nauseated on the airplane due to kidney stone MUSCULOSKELETAL:no cyanosis of digits and no clubbing  NEURO: alert & oriented x 3 with fluent speech, no focal motor/sensory deficits EXTREMITIES: No lower extremity edema  LABORATORY DATA:  I have reviewed the data as listed   Chemistry      Component Value Date/Time   NA 140 08/10/2015 1339   K 4.9 08/10/2015 1339   CO2 24 08/10/2015 1339   BUN 12.2 08/10/2015 1339   CREATININE 0.8 08/10/2015 1339      Component Value Date/Time   CALCIUM 10.0 08/10/2015 1339   ALKPHOS 42 08/10/2015 1339   AST 16 08/10/2015 1339   ALT 11 08/10/2015 1339   BILITOT 0.30 08/10/2015 1339  Lab Results  Component Value Date   WBC 4.3 09/15/2015   HGB 11.2 (L) 09/15/2015   HCT 33.6 (L) 09/15/2015   MCV 89.8 09/15/2015   PLT 242 09/15/2015   NEUTROABS 2.3 09/15/2015   ASSESSMENT & PLAN:  Breast cancer of lower-outer quadrant of left female breast ([place]) Left breast mastectomy 03/09/2015 showed grade 1 invasive ductal carcinoma, 4 cm, 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 Pathologic staging: T2 N0 stage II a Oncotype DX score: 9, low risk, 6% risk of recurrence Genetic testing: No mutations identified, VUS PMS-2 Breast reconstruction  -------------------------------------------------------------------------------------------------- Current treatment: Tamoxifen 20 mg daily started March 2017 (premenopausal based on blood work showing [place] 5.3) Clinical trial: PALLAS and enrolled  08/10/2015, patient randomized to [facility] Current dose: 125 mg daily  Tamoxifen toxicities: 1. Occasional night sweats due to tamoxifen 2. ankle pain which has resolved  Ibrance toxicities: 1. Mild anemia: Due to Ibrance  2. mild fatigue Patient still continues to exercise 4 miles every day and lift weights. I encouraged her to continue with the same.  Kidney stone: Patient is drinking adequate water trying to pass a stone. If her symptoms do not get better and her pain gets worse, we may have to obtain a CT of the abdomen and may have to prescribe her narcotic pain medication. Currently she appears to be doing quite well with only a mild discomfort in the left flank area.  Weight gain: Patient's BMI is normal. She is still very much focused on the recent weight gain. I instructed her that she could join the [employer] live strong program. She could have an instructor who can assist her with the right exercises for her body.  Return to clinic in 2 weeks for blood count check and in one month to see me.   No orders of the defined types were placed in this encounter.  The patient has a good understanding of the overall plan. she agrees with it. she will call with any problems that may develop before the next visit here.   Rulon Eisenmenger, MD 09/15/15   Addendum: Patient complains of left flank pain and not right flank pain as mentioned above.

## 2015-09-15 NOTE — Telephone Encounter (Signed)
appt made and avs printed °

## 2015-09-25 ENCOUNTER — Other Ambulatory Visit: Payer: Self-pay | Admitting: Adult Health

## 2015-09-25 DIAGNOSIS — C50512 Malignant neoplasm of lower-outer quadrant of left female breast: Secondary | ICD-10-CM

## 2015-09-25 NOTE — Progress Notes (Signed)
On PALLAS trial

## 2015-09-28 ENCOUNTER — Encounter: Payer: Federal, State, Local not specified - PPO | Admitting: Adult Health

## 2015-09-28 ENCOUNTER — Telehealth: Payer: Self-pay | Admitting: Medical Oncology

## 2015-09-28 ENCOUNTER — Encounter: Payer: Self-pay | Admitting: Adult Health

## 2015-09-28 ENCOUNTER — Other Ambulatory Visit (HOSPITAL_BASED_OUTPATIENT_CLINIC_OR_DEPARTMENT_OTHER): Payer: Federal, State, Local not specified - PPO

## 2015-09-28 DIAGNOSIS — Z006 Encounter for examination for normal comparison and control in clinical research program: Secondary | ICD-10-CM

## 2015-09-28 DIAGNOSIS — C50512 Malignant neoplasm of lower-outer quadrant of left female breast: Secondary | ICD-10-CM | POA: Diagnosis not present

## 2015-09-28 LAB — CBC WITH DIFFERENTIAL/PLATELET
BASO%: 4.7 % — ABNORMAL HIGH (ref 0.0–2.0)
BASOS ABS: 0.2 10*3/uL — AB (ref 0.0–0.1)
EOS ABS: 0 10*3/uL (ref 0.0–0.5)
EOS%: 0.9 % (ref 0.0–7.0)
HEMATOCRIT: 36.3 % (ref 34.8–46.6)
HEMOGLOBIN: 12.2 g/dL (ref 11.6–15.9)
LYMPH#: 1.4 10*3/uL (ref 0.9–3.3)
LYMPH%: 34.7 % (ref 14.0–49.7)
MCH: 30.4 pg (ref 25.1–34.0)
MCHC: 33.6 g/dL (ref 31.5–36.0)
MCV: 90.3 fL (ref 79.5–101.0)
MONO#: 0.2 10*3/uL (ref 0.1–0.9)
MONO%: 5.9 % (ref 0.0–14.0)
NEUT#: 2.1 10*3/uL (ref 1.5–6.5)
NEUT%: 53.8 % (ref 38.4–76.8)
Platelets: 359 10*3/uL (ref 145–400)
RBC: 4.02 10*6/uL (ref 3.70–5.45)
RDW: 17.2 % — AB (ref 11.2–14.5)
WBC: 4 10*3/uL (ref 3.9–10.3)

## 2015-09-28 LAB — COMPREHENSIVE METABOLIC PANEL
ALBUMIN: 3.7 g/dL (ref 3.5–5.0)
ALK PHOS: 39 U/L — AB (ref 40–150)
ALT: 13 U/L (ref 0–55)
AST: 16 U/L (ref 5–34)
Anion Gap: 7 mEq/L (ref 3–11)
BUN: 15.4 mg/dL (ref 7.0–26.0)
CALCIUM: 9.5 mg/dL (ref 8.4–10.4)
CO2: 25 mEq/L (ref 22–29)
CREATININE: 0.9 mg/dL (ref 0.6–1.1)
Chloride: 109 mEq/L (ref 98–109)
EGFR: 79 mL/min/{1.73_m2} — ABNORMAL LOW (ref >90)
Glucose: 95 mg/dl (ref 70–140)
POTASSIUM: 5.1 meq/L (ref 3.5–5.1)
Sodium: 140 mEq/L (ref 136–145)
Total Protein: 7 g/dL (ref 6.4–8.3)

## 2015-09-28 NOTE — Progress Notes (Signed)
Patient late to her appointment for survivorship today; notified by registration of patient's arrival at 9:07 am.  Notified registration that we would need to reschedule her appt as patient could not stay for her survivorship visit because she had to go to work.  Registration to send patient to lab as previously scheduled.    Mike Craze, NP [place] [phone]

## 2015-09-28 NOTE — Progress Notes (Deleted)
Patient unable to stay for her appointment with survivorship.  We will contact the patient to reschedule or offer to mail the resources to her home.    Mike Craze, NP Survivorship Program [place] [phone]  (Coding/Billing notation: This patient is a no charge; pt was not seen or evaluated today).

## 2015-09-28 NOTE — Telephone Encounter (Signed)
PALLAS: cycle 2/day 14 Call to patient x3. LVMOM with patient informing her of her lab results from today, labs are within normal ranges to continue with Cycle 2 treatment, and that patient is to continue with the last week of palbociclib with day 21 being on September 25, patient to take 7 day break as instructed and to return October 2nd for labs and MD visit for cycle 3. Asked patient to return call to me to let me know how she is doing.

## 2015-09-29 ENCOUNTER — Telehealth: Payer: Self-pay | Admitting: Medical Oncology

## 2015-09-29 NOTE — Telephone Encounter (Signed)
PALLAS: [id number] Patient returned my call this afternoon informed me that she had received my message and states she has been taking the palbociclib as instructed. Patient states that she has been feeling "very tired" and has had "flu-like symptoms, back hurting, for the past week or so" she denies any other symptoms. She states that she has continued with her exercise and walking, but feels very tired. Patient informed that I would talk to Dr. Lindi Adie about her symptoms and call her back.  Follow-up appointment for C3 labs and MD visit= 10/02. Dr. Lindi Adie in-boxed with patient concerns. Adele Dan, RN, BSN Clinical Research 09/29/2015 5:12 PM

## 2015-09-30 ENCOUNTER — Telehealth: Payer: Self-pay | Admitting: Medical Oncology

## 2015-09-30 NOTE — Telephone Encounter (Signed)
PALLAS Return call to patient regarding patient's report of increased fatigue and description of symptoms feeling flu-like. Review with Dr. Lindi Adie and informed patient that if she is feeling like she may have the flu, patient can come in for flu lab check. Patient states she does not feel that she has the flu, only that the aches and tiredness that she has had, feel that way. Patient reports feeling somewhat better today, but still fatigue. Patient confirms that she is able to perform her normal daily activities. Informed patient that fatigue is one of the side effects of palbociclib and should fatigue continue and start to interferes with her normal daily life, MD can assess this when she is here on 10/2 for office visit, palbociclib dosage may need to be reduced. Patient gave verbal understanding, all her questions answered to her satisfaction and she was encouraged to call clinic should she have any new, worsening or unusual symptoms.  Adele Dan, RN, BSN Clinical Research 09/30/2015 4:49 PM

## 2015-10-05 ENCOUNTER — Telehealth: Payer: Self-pay | Admitting: Medical Oncology

## 2015-10-05 NOTE — Telephone Encounter (Signed)
Patient Joann Collins with me asking about the increase in her platelets. Platelets are WNL, but patient wondering why they are increasing. Spoke with Dr. Lindi Adie regarding patient's concerns. Per Dr. Lindi Adie, he will call her patient today.

## 2015-10-08 ENCOUNTER — Other Ambulatory Visit: Payer: Self-pay

## 2015-10-08 DIAGNOSIS — C50512 Malignant neoplasm of lower-outer quadrant of left female breast: Secondary | ICD-10-CM

## 2015-10-12 ENCOUNTER — Encounter: Payer: Self-pay | Admitting: Hematology and Oncology

## 2015-10-12 ENCOUNTER — Other Ambulatory Visit (HOSPITAL_BASED_OUTPATIENT_CLINIC_OR_DEPARTMENT_OTHER): Payer: Federal, State, Local not specified - PPO

## 2015-10-12 ENCOUNTER — Ambulatory Visit (HOSPITAL_BASED_OUTPATIENT_CLINIC_OR_DEPARTMENT_OTHER): Payer: Federal, State, Local not specified - PPO | Admitting: Hematology and Oncology

## 2015-10-12 ENCOUNTER — Encounter: Payer: Self-pay | Admitting: Medical Oncology

## 2015-10-12 DIAGNOSIS — Z79810 Long term (current) use of selective estrogen receptor modulators (SERMs): Secondary | ICD-10-CM | POA: Diagnosis not present

## 2015-10-12 DIAGNOSIS — Z006 Encounter for examination for normal comparison and control in clinical research program: Secondary | ICD-10-CM

## 2015-10-12 DIAGNOSIS — C50512 Malignant neoplasm of lower-outer quadrant of left female breast: Secondary | ICD-10-CM

## 2015-10-12 DIAGNOSIS — Z170 Estrogen receptor positive status [ER+]: Secondary | ICD-10-CM

## 2015-10-12 LAB — CBC WITH DIFFERENTIAL/PLATELET
BASO%: 2 % (ref 0.0–2.0)
BASOS ABS: 0.1 10*3/uL (ref 0.0–0.1)
EOS ABS: 0 10*3/uL (ref 0.0–0.5)
EOS%: 0.7 % (ref 0.0–7.0)
HEMATOCRIT: 36.4 % (ref 34.8–46.6)
HEMOGLOBIN: 12.1 g/dL (ref 11.6–15.9)
LYMPH%: 38.2 % (ref 14.0–49.7)
MCH: 30.6 pg (ref 25.1–34.0)
MCHC: 33.2 g/dL (ref 31.5–36.0)
MCV: 92 fL (ref 79.5–101.0)
MONO#: 0.5 10*3/uL (ref 0.1–0.9)
MONO%: 9.8 % (ref 0.0–14.0)
NEUT#: 2.5 10*3/uL (ref 1.5–6.5)
NEUT%: 49.3 % (ref 38.4–76.8)
PLATELETS: 191 10*3/uL (ref 145–400)
RBC: 3.96 10*6/uL (ref 3.70–5.45)
RDW: 17.7 % — AB (ref 11.2–14.5)
WBC: 5.1 10*3/uL (ref 3.9–10.3)
lymph#: 1.9 10*3/uL (ref 0.9–3.3)

## 2015-10-12 LAB — COMPREHENSIVE METABOLIC PANEL
ALT: 13 U/L (ref 0–55)
ANION GAP: 11 meq/L (ref 3–11)
AST: 17 U/L (ref 5–34)
Albumin: 4 g/dL (ref 3.5–5.0)
Alkaline Phosphatase: 44 U/L (ref 40–150)
BUN: 7.5 mg/dL (ref 7.0–26.0)
CALCIUM: 9.6 mg/dL (ref 8.4–10.4)
CO2: 25 meq/L (ref 22–29)
CREATININE: 0.8 mg/dL (ref 0.6–1.1)
Chloride: 109 mEq/L (ref 98–109)
EGFR: >90 mL/min/{1.73_m2} (ref >90)
Glucose: 97 mg/dl (ref 70–140)
Potassium: 4.8 mEq/L (ref 3.5–5.1)
Sodium: 144 mEq/L (ref 136–145)
TOTAL PROTEIN: 7.5 g/dL (ref 6.4–8.3)

## 2015-10-12 MED ORDER — INV-PALBOCICLIB 125 MG CAPS #23 ALLIANCE FOUNDATION AFT-05 (PALLAS)
125.0000 mg | ORAL_CAPSULE | Freq: Every day | ORAL | 0 refills | Status: DC
Start: 2015-10-12 — End: 2016-06-07

## 2015-10-12 NOTE — Assessment & Plan Note (Signed)
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 Pathologic staging: T2 N0 stage II a Oncotype DX score: 9, low risk, 6% risk of recurrence Genetic testing: No mutations identified, VUS PMS-2 Breast reconstruction  -------------------------------------------------------------------------------------------------- Current treatment: Tamoxifen 20 mg daily started March 2017 (premenopausal based on blood work showing [place] 5.3) Clinical trial: PALLAS and enrolled 08/10/2015, patient randomized to [facility] Current dose: 125 mg daily  Tamoxifen toxicities: 1. Occasional night sweats due to tamoxifen 2. ankle pain which has resolved  Ibrance toxicities: 1. Mild anemia: Due to Ibrance  2. mild fatigue Patient still continues to exercise 4 miles every day and lift weights. I encouraged her to continue with the same.  Kidney stone: Patient is drinking adequate water trying to pass a stone. If her symptoms do not get better and her pain gets worse, we may have to obtain a CT of the abdomen and may have to prescribe her narcotic pain medication. Currently she appears to be doing quite well with only a mild discomfort in the left flank area.  Weight gain: Patient's BMI is normal. She is still very much focused on the recent weight gain. I instructed her that she could join the [employer] live strong program. She could have an instructor who can assist her with the right exercises for her body.  Return to clinic in 2 weeks for blood count check and in one month to see me.

## 2015-10-12 NOTE — Progress Notes (Signed)
Patient Care Team: Curly Rim, MD as PCP - General (Family Medicine) Truitt Merle, MD as Consulting Physician (Hematology) Autumn Messing III, MD as Consulting Physician (General Surgery)  DIAGNOSIS: Breast cancer of lower-outer quadrant of left female breast [facility])   Staging form: Breast, AJCC 7th Edition   - Clinical: Stage IIB (T3, N0, M0) - Unsigned   - Pathologic stage from 03/09/2015: Stage IIA (T2, N0, cM0) - Signed by Truitt Merle, MD on 03/30/2015  SUMMARY OF ONCOLOGIC HISTORY: Oncology History   Breast cancer of lower-outer quadrant of left female breast ([place])   Staging form: Breast, AJCC 7th Edition     Clinical: Stage IIB (T3, N0, M0) - Unsigned     Pathologic stage from 03/09/2015: Stage IIA (T2, N0, cM0) - Signed by Truitt Merle, MD on 03/30/2015       Breast cancer of lower-outer quadrant of left female breast ([place])   01/27/2015 Mammogram    diagnostic mammogram and ultrasound showed architectural distortion within the left outer breast, 3:00 position. Area of biopsied architectural distortion measures approx 2.2 x 1.8 x 1.4 cm.       01/29/2015 Initial Biopsy    (L) breast mass biopsy showed low-grade invasive ductal carcinoma & DCIS; ER+ (95%), PR+ (80%), HER2 neg (ratio 1.22), Ki67 3%.       02/06/2015 Breast MRI    Non-masslike enhancement measuring 1.4 x 6.3 x 3.9 cm at the left breast 3 to 5:00 position. Enhancement extends to skin laterally with mild enhancement of adjacent skin, tumor extension in skin not excluded. No adenopathy. Bilat implants noted       03/09/2015 Surgery    (L) breast simple mastectomy with SLNB/immediate reconstruction (Toth/Thimmappa): IDC, 4 cm, grade 1, extensive grade 2 DCIS with associated calcs; margins negative. 0/4 SLN. HER2 repeated, remains negative (ratio 1.12).  pT2,pN0: Stage IIA      03/09/2015 Oncotype testing    Recurrence score 9; 6% ROR (low risk)      03/25/2015 -  Anti-estrogen oral therapy    Tamoxifen 20 mg daily      05/11/2015 Miscellaneous    Genetic testing. 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 Genes evaluated: ATM, BARD1, BRCA1, BRCA2, BRIP1, CDH1, CHEK2, EPCAM, FANCC, MLH1, MSH2, MSH6, NBN, PALB2, PMS2, PTEN, RAD51C, RAD51D, TP53, & XRCC2.       06/06/2015 Surgery    Removal of left breast tissue expander and placement of saline implant (Thimmappa)      08/17/2015 Miscellaneous    PALLAS Trial: Randomized to Ibrance arm.  Started Cycle #1 08/17/15       CHIEF COMPLIANT: Follow-up on Ibrance with letrozole  INTERVAL HISTORY: Joann Collins is a 50 year old with above-mentioned history of left breast cancer currently on adjuvant antiestrogen therapy. She is currently on clinical trial and is randomized to [facility] with letrozole. She is here today to start third month for treatment. Patient had fatigue as her main side effect. This fatigue started 2 weeks ago and has currently resolved. Apart from this she has no other major complaints. She has noticed some decrease in memory since the breast cancer diagnosis. She denies any problems with hot flashes or myalgias.  REVIEW OF SYSTEMS:   Constitutional: Denies fevers, chills or abnormal weight loss Eyes: Denies blurriness of vision Ears, nose, mouth, throat, and face: Denies mucositis or sore throat Respiratory: Denies cough, dyspnea or wheezes Cardiovascular: Denies palpitation, chest discomfort Gastrointestinal:  Denies nausea, heartburn or change in bowel habits Skin: Denies abnormal skin  rashes Lymphatics: Denies new lymphadenopathy or easy bruising Neurological:Denies numbness, tingling or new weaknesses Behavioral/Psych: Mood is stable, no new changes  Extremities: No lower extremity edema Breast:  denies any pain or lumps or nodules in either breasts All other systems were reviewed with the patient and are negative.  I have reviewed the past medical history, past surgical history, social history and family history with the patient and they are  unchanged from previous note.  ALLERGIES:  is allergic to penicillins.  MEDICATIONS:  Current Outpatient Prescriptions  Medication Sig Dispense Refill  . acyclovir (ZOVIRAX) 800 MG tablet Take 800 mg by mouth daily.    . cholecalciferol 4000 units TABS Take 1,000 Units by mouth daily.    . Cyanocobalamin 1000 MCG/15ML LIQD 5000 micrograms daily 450 mL   . Investigational palbociclib (IBRANCE) 125 MG capsule [facility] AFT-05 PALLAS Take 1 capsule (125 mg total) by mouth daily. Take with food. Swallow whole. Do not chew. Take on days 1-21. Repeat every 28 days. 23 capsule 0  . Investigational palbociclib (IBRANCE) 125 MG capsule [facility] AFT-05 PALLAS Take 1 capsule (125 mg total) by mouth daily. Take with food. Swallow whole. Do not chew. Take on days 1-21. Repeat every 28 days. 23 capsule 0  . Multiple Vitamin (MULTIVITAMIN WITH MINERALS) TABS tablet Take 1 tablet by mouth daily.    . tamoxifen (NOLVADEX) 20 MG tablet Take 1 tablet (20 mg total) by mouth daily. 30 tablet 2   No current facility-administered medications for this visit.     PHYSICAL EXAMINATION: ECOG PERFORMANCE STATUS: 0 - Asymptomatic  There were no vitals filed for this visit. There were no vitals filed for this visit.  GENERAL:alert, no distress and comfortable SKIN: skin color, texture, turgor are normal, no rashes or significant lesions EYES: normal, Conjunctiva are pink and non-injected, sclera clear OROPHARYNX:no exudate, no erythema and lips, buccal mucosa, and tongue normal  NECK: supple, thyroid normal size, non-tender, without nodularity LYMPH:  no palpable lymphadenopathy in the cervical, axillary or inguinal LUNGS: clear to auscultation and percussion with normal breathing effort HEART: regular rate & rhythm and no murmurs and no lower extremity edema ABDOMEN:abdomen soft, non-tender and normal bowel sounds MUSCULOSKELETAL:no cyanosis of digits and no clubbing  NEURO: alert &  oriented x 3 with fluent speech, no focal motor/sensory deficits EXTREMITIES: No lower extremity edema  LABORATORY DATA:  I have reviewed the data as listed   Chemistry      Component Value Date/Time   NA 140 09/28/2015 0916   K 5.1 09/28/2015 0916   CO2 25 09/28/2015 0916   BUN 15.4 09/28/2015 0916   CREATININE 0.9 09/28/2015 0916      Component Value Date/Time   CALCIUM 9.5 09/28/2015 0916   ALKPHOS 39 (L) 09/28/2015 0916   AST 16 09/28/2015 0916   ALT 13 09/28/2015 0916   BILITOT <0.30 09/28/2015 0916       Lab Results  Component Value Date   WBC 5.1 10/12/2015   HGB 12.1 10/12/2015   HCT 36.4 10/12/2015   MCV 92.0 10/12/2015   PLT 191 10/12/2015   NEUTROABS 2.5 10/12/2015     ASSESSMENT & PLAN:  Breast cancer of lower-outer quadrant of left female breast ([place]) Left breast mastectomy 03/09/2015 showed grade 1 invasive ductal carcinoma, 4 cm, 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 Pathologic staging: T2 N0 stage II a Oncotype DX score: 9, low  risk, 6% risk of recurrence Genetic testing: No mutations identified, VUS PMS-2 Breast reconstruction  -------------------------------------------------------------------------------------------------- Current treatment: Tamoxifen 20 mg daily started March 2017 (premenopausal based on blood work showing [place] 5.3) Clinical trial: PALLAS and enrolled 08/10/2015, patient randomized to [facility] Current dose: 125 mg daily  Tamoxifen toxicities: 1. Occasional night sweats due to tamoxifen 2. ankle pain which has resolved  Ibrance toxicities: 1. Mild anemia: Due to Ibrance  2. mild fatigue: Resolved Patient still continues to exercise 4 miles every day and lift weights. I encouraged her to continue with the same.   Weight gain: Patient's BMI is normal. She is still very much focused on the recent weight gain.  Return  to clinic in 3 months for blood count check and follow-up in December 2017.   No orders of the defined types were placed in this encounter.  The patient has a good understanding of the overall plan. she agrees with it. she will call with any problems that may develop before the next visit here.   Rulon Eisenmenger, MD 10/12/15

## 2015-10-12 NOTE — Progress Notes (Signed)
PALLAS Cycle 3 (4 & 5)/Day 1 Patient here today for labs and MD assessment prior to start of Cycle 3. Patient returned study dispensed palbociclib cycle 2 bottle, empty (given to PharmD Carolynne Edouard for documentation) along with her medication diaries for cycle two, documenting no missed palbociclib and tamoxifen doses. Patient denies have any difficulty with self-administration or documentation of medications. Patient confirms not changes to her concomitant medications. V/S completed. Patient presents with a "cold" today and present for approximately 1 week now, denies any fever. Patient reprots mild fatigue, confirms fatigue does not interfere with her normal routine and states fatigue started approximately 2 weeks ago and reports resolution. Patient reports to have noticed some memory decrease since her cancer diagnosis and was encouraged to try some memory exercises. Patient denies any other problems or concerns at this time. Per MD's review of all patient labs, including NCS labs as well as a physical assessment of patient, patient cleared to start Cycle 3. Study dispensed medication assignment provided to PharmD Carolynne Edouard for dispensing and this nurse dispensed to patient bottles # [id number], [id number], [phone] along with medication diaries for the next three cycles. Patient was educated on the start and stop dates for each cycle through to cycle 5 and was encouraged to call Dr. Lindi Adie or myself with any questions or concerns she may have. Patient knows to start palbociclib, Tuesday, October 3, and to continue with tamoxifen as instructed. All patient's questions answered to her satisfaction. Patient to return for next scheduled visit at the end of cycle 5, or sooner should she have any concerns.  Adele Dan, RN, BSN Clinical Research 10/12/2015 4:23 PM

## 2015-10-15 ENCOUNTER — Telehealth: Payer: Self-pay | Admitting: *Deleted

## 2015-10-15 ENCOUNTER — Other Ambulatory Visit: Payer: Self-pay | Admitting: Hematology and Oncology

## 2015-10-15 ENCOUNTER — Telehealth: Payer: Self-pay | Admitting: Hematology and Oncology

## 2015-10-15 DIAGNOSIS — C50512 Malignant neoplasm of lower-outer quadrant of left female breast: Secondary | ICD-10-CM

## 2015-10-15 NOTE — Telephone Encounter (Signed)
Patient called to inform that she is out of town and had forgotten her tamoxifen at home. Patient asking if prescription could be called in to the [facility] pharmacy in [place]. I spoke with Dr. Lindi Adie, and per Dr. Lindi Adie, send prescription to the pharmacy that she has requested, for 20 mg Tamoxifen. Call back to patient and informed her will send prescription to the pharmacy. Patient provided the information for which pharmacy she wanted it sent to: [facility], [phone], [address], [place]. I spoke with Pharmacist Juanna Cao and was informed they will get this order for Tamoxifen filled. Patient is aware that she will receive a message from pharmacy when prescription is ready.  Adele Dan, RN, BSN Clinical Research 10/15/2015 5:10 PM

## 2015-10-15 NOTE — Telephone Encounter (Signed)
Spoke with pt to confirm 12/21 appt per LOS

## 2015-10-26 ENCOUNTER — Other Ambulatory Visit: Payer: Federal, State, Local not specified - PPO

## 2015-11-09 ENCOUNTER — Ambulatory Visit: Payer: Federal, State, Local not specified - PPO | Admitting: Hematology and Oncology

## 2015-11-17 ENCOUNTER — Encounter: Payer: Self-pay | Admitting: Hematology and Oncology

## 2015-11-17 NOTE — Telephone Encounter (Signed)
PALLAS LVMOM with patient regarding her e-mail, to Dr. Lindi Adie, of not wanting to be on study anymore. Asked patient to call me at her earliest convenience. Adele Dan, RN, BSN Clinical Research 11/17/2015 2:22 PM

## 2015-11-18 ENCOUNTER — Encounter: Payer: Self-pay | Admitting: Medical Oncology

## 2015-11-18 DIAGNOSIS — C50512 Malignant neoplasm of lower-outer quadrant of left female breast: Secondary | ICD-10-CM

## 2015-11-18 DIAGNOSIS — Z170 Estrogen receptor positive status [ER+]: Principal | ICD-10-CM

## 2015-11-18 NOTE — Progress Notes (Signed)
PALLAS I talked to patient this morning regarding her decision to stop study drug palbociclib. Patient reports that she does not wish to take palbociclib any more. Patient reports that she had increased fatigue (Grade 2) and "just wasn't feeling like myself anymore," as well as reports that her hair started falling out sometime in mid-October. Patient reports to have stopped after C3 and had not started C4 due to realizing how much better she felt during that 7 day break. I asked patient about dose reduction or the option of holding palbociclib until she returns to baseline. Patient states she would rather not take palbociclib anymore due to the side effects she has been having and declined these options. I reviewed with her if she would be willing to stay on study and continue with data collection without taking palbociclib and patient replied that she would like to do that and help with data collection as much as possible. I informed patient that Dr. Lindi Adie will be back on 11/13 and I will inform him of her decision and we will set up an appointment for her to see him and complete the EOT visit as well as the  Consent for Treatment Withdrawal and I will collect her study dispensed medication bottles and medication diaries during that visit as well. Patient gave verbal understanding. All patients questions answered to her satisfaction. I thanked patient for her willingness to stay on study for data collection and for her time and encouraged her to contact me in the meantime should she have any questions.  Patient aware EOT appt to be scheduled upon Dr. Geralyn Flash return and appt availability.  Adele Dan, RN, BSN Clinical Research 11/18/2015 11:10 AM

## 2015-11-23 ENCOUNTER — Telehealth: Payer: Self-pay | Admitting: Hematology and Oncology

## 2015-11-23 NOTE — Telephone Encounter (Signed)
lvm to inform pt of 11/27 appt date/time per LOS. Gave pt next available appt for VG

## 2015-11-24 ENCOUNTER — Other Ambulatory Visit: Payer: Self-pay | Admitting: Medical Oncology

## 2015-11-24 DIAGNOSIS — Z170 Estrogen receptor positive status [ER+]: Principal | ICD-10-CM

## 2015-11-24 DIAGNOSIS — C50512 Malignant neoplasm of lower-outer quadrant of left female breast: Secondary | ICD-10-CM

## 2015-12-07 ENCOUNTER — Encounter: Payer: Self-pay | Admitting: Medical Oncology

## 2015-12-07 ENCOUNTER — Encounter: Payer: Self-pay | Admitting: Hematology and Oncology

## 2015-12-07 ENCOUNTER — Ambulatory Visit (HOSPITAL_BASED_OUTPATIENT_CLINIC_OR_DEPARTMENT_OTHER): Payer: Federal, State, Local not specified - PPO | Admitting: Hematology and Oncology

## 2015-12-07 ENCOUNTER — Other Ambulatory Visit (HOSPITAL_BASED_OUTPATIENT_CLINIC_OR_DEPARTMENT_OTHER): Payer: Federal, State, Local not specified - PPO

## 2015-12-07 DIAGNOSIS — Z170 Estrogen receptor positive status [ER+]: Secondary | ICD-10-CM

## 2015-12-07 DIAGNOSIS — Z79810 Long term (current) use of selective estrogen receptor modulators (SERMs): Secondary | ICD-10-CM

## 2015-12-07 DIAGNOSIS — C50512 Malignant neoplasm of lower-outer quadrant of left female breast: Secondary | ICD-10-CM

## 2015-12-07 DIAGNOSIS — Z006 Encounter for examination for normal comparison and control in clinical research program: Secondary | ICD-10-CM

## 2015-12-07 LAB — RESEARCH LABS

## 2015-12-07 LAB — COMPREHENSIVE METABOLIC PANEL
ALT: 11 U/L (ref 0–55)
AST: 18 U/L (ref 5–34)
Albumin: 3.9 g/dL (ref 3.5–5.0)
Alkaline Phosphatase: 43 U/L (ref 40–150)
Anion Gap: 7 mEq/L (ref 3–11)
BILIRUBIN TOTAL: 0.3 mg/dL (ref 0.20–1.20)
BUN: 9.3 mg/dL (ref 7.0–26.0)
CHLORIDE: 108 meq/L (ref 98–109)
CO2: 25 meq/L (ref 22–29)
CREATININE: 0.8 mg/dL (ref 0.6–1.1)
Calcium: 9.7 mg/dL (ref 8.4–10.4)
EGFR: 90 mL/min/{1.73_m2} — ABNORMAL LOW (ref >90)
GLUCOSE: 95 mg/dL (ref 70–140)
Potassium: 5.1 mEq/L (ref 3.5–5.1)
SODIUM: 140 meq/L (ref 136–145)
TOTAL PROTEIN: 7.1 g/dL (ref 6.4–8.3)

## 2015-12-07 LAB — CBC WITH DIFFERENTIAL/PLATELET
BASO%: 1.6 % (ref 0.0–2.0)
Basophils Absolute: 0.1 10*3/uL (ref 0.0–0.1)
EOS%: 2.7 % (ref 0.0–7.0)
Eosinophils Absolute: 0.2 10*3/uL (ref 0.0–0.5)
HCT: 38.9 % (ref 34.8–46.6)
HGB: 12.9 g/dL (ref 11.6–15.9)
LYMPH%: 22.5 % (ref 14.0–49.7)
MCH: 31.2 pg (ref 25.1–34.0)
MCHC: 33.2 g/dL (ref 31.5–36.0)
MCV: 94 fL (ref 79.5–101.0)
MONO#: 0.6 10*3/uL (ref 0.1–0.9)
MONO%: 8.2 % (ref 0.0–14.0)
NEUT%: 65 % (ref 38.4–76.8)
NEUTROS ABS: 5.1 10*3/uL (ref 1.5–6.5)
PLATELETS: 210 10*3/uL (ref 145–400)
RBC: 4.13 10*6/uL (ref 3.70–5.45)
RDW: 15.3 % — ABNORMAL HIGH (ref 11.2–14.5)
WBC: 7.9 10*3/uL (ref 3.9–10.3)
lymph#: 1.8 10*3/uL (ref 0.9–3.3)

## 2015-12-07 LAB — HEMOGLOBIN A1C
Est. average glucose Bld gHb Est-mCnc: 114 mg/dL
Hemoglobin A1c: 5.6 % (ref 4.8–5.6)

## 2015-12-07 NOTE — Progress Notes (Signed)
PALLAS: EOT visit Patient here today for her EOT visit for labs and appointment with Dr. Lindi Adie. Patient has decided to stop treatment with palbociclib due to having adverse effects that she does not wish to continue with. I met with patient upon her arrival for vital signs and appointment with Dr. Lindi Adie. Patient reports to have had an increase in fatigue and reports to have been "very tired" as well as "feeling blue and depressed" and "having no drive" while on study drug palbociclib, as well as having myalgia in bilateral legs, with these symptoms starting approximately sometime in October. Patient reports that these symptoms had resolved by day 10 of being off palbociclib, at the end of Cycle 3 during the 7 day break, when patient decided she did not wish to continue with and start cycle 4. Patient today states that she is feeling "so much better" and "like herself." Patient was offered a dose reduction to see if symptoms lessened, but prefers to stop taking palbociclib completely. Adverse events reported were within a Grade 1 rating, except for fatigue which increased to a Grade 2 in October. EOT labs collected and concomitant medications reviewed. During this visit, patient reports to have been having left lower back pain for approximately "one and half months," reports pain a 3-4 on a scale of 10 and is a constant pain. Patient states that she has tried a professional massage to alleviate the pain, but has had no success. Dr. Lindi Adie aware and to schedule bone scan. Patient had forgotten to bring her Cycle 3, 4 and 5 study dispensed palbociclib bottles as well as her medication diaries with her today for documentation of return. Patient states that she can have her husband return them to me tomorrow, since he works in town.   Patient has signed Withdrawal of Treatment Consent form today, and has agreed to be followed for data collection. Copy of her signed Withdrawal of Treatment consent was provided to  patient for her records. Patient informed next follow up for study will be mid January to late February ( 6 months). All patient's questions answered to her satisfaction, patient thanked for her time and continued support of study and encouraged to call Dr. Lindi Adie or myself with any questions/concerns. Patient will continue with her daily anti hormone tamoxifen as prescribed by Dr. Lindi Adie.   Adele Dan, RN, BSN Clinical Research 12/07/2015 11:19 AM

## 2015-12-07 NOTE — Progress Notes (Signed)
[facility] CONSULT NOTE  Patient Care Team: Curly Rim, MD as PCP - General (Family Medicine) Truitt Merle, MD as Consulting Physician (Hematology) Autumn Messing III, MD as Consulting Physician (General Surgery)  CHIEF COMPLAINTS/PURPOSE OF CONSULTATION:  Patient is coming off clinical trial PALLAS  HISTORY OF PRESENTING ILLNESS:  Joann Collins 50 y.o. 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 She has been on tamoxifen and decided to enroll in clinical trial with Ibrance. She took [place] for a few months and decided that she does not continue with Ibrance any further because of lack of drive as well as fatigue and myalgias. Since she stopped Ibrance she is feeling a lot better. She also has had low back pain over the past month and half. She rates her 3-4 out of 10. It is not aggravated by any physical activity does not appears to get relief with rest either. She had massage and did not help her. She took muscle relaxant and that even that did not help her.  I reviewed her records extensively and collaborated the history with the patient.  SUMMARY OF ONCOLOGIC HISTORY: Oncology History   Breast cancer of lower-outer quadrant of left female breast ([place])   Staging form: Breast, AJCC 7th Edition     Clinical: Stage IIB (T3, N0, M0) - Unsigned     Pathologic stage from 03/09/2015: Stage IIA (T2, N0, cM0) - Signed by Truitt Merle, MD on 03/30/2015       Breast cancer of lower-outer quadrant of left female breast ([place])   01/27/2015 Mammogram    diagnostic mammogram and ultrasound showed architectural distortion within the left outer breast, 3:00 position. Area of biopsied architectural distortion measures approx 2.2 x 1.8 x 1.4 cm.       01/29/2015 Initial Biopsy    (L) breast mass biopsy showed low-grade invasive ductal carcinoma & DCIS; ER+ (95%), PR+ (80%),  HER2 neg (ratio 1.22), Ki67 3%.       02/06/2015 Breast MRI    Non-masslike enhancement measuring 1.4 x 6.3 x 3.9 cm at the left breast 3 to 5:00 position. Enhancement extends to skin laterally with mild enhancement of adjacent skin, tumor extension in skin not excluded. No adenopathy. Bilat implants noted       03/09/2015 Surgery    (L) breast simple mastectomy with SLNB/immediate reconstruction ([place]): IDC, 4 cm, grade 1, extensive grade 2 DCIS with associated calcs; margins negative. 0/4 SLN. HER2 repeated, remains negative (ratio 1.12).  pT2,pN0: Stage IIA      03/09/2015 Oncotype testing    Recurrence score 9; 6% ROR (low risk)      03/25/2015 -  Anti-estrogen oral therapy    Tamoxifen 20 mg daily      05/11/2015 Miscellaneous    Genetic testing. 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 Genes evaluated: ATM, BARD1, BRCA1, BRCA2, BRIP1, CDH1, CHEK2, EPCAM, FANCC, MLH1, MSH2, MSH6, NBN, PALB2, PMS2, PTEN, RAD51C, RAD51D, TP53, & XRCC2.       06/06/2015 Surgery    Removal of left breast tissue expander and placement of saline implant ([place])      08/17/2015 Miscellaneous    PALLAS Trial: Randomized to Ibrance arm.  Started Cycle #1 08/17/15; discontinued from this study due to lack of drive, fatigue and myalgias      MEDICAL HISTORY:  Past Medical History:  Diagnosis Date  . Cancer ([place])  left breast cancer  . Family history of breast cancer   . Family history of pancreatic cancer   . Spinal headache    with second c-section    SURGICAL HISTORY: Past Surgical History:  Procedure Laterality Date  . ABDOMINAL HYSTERECTOMY    . BREAST ENHANCEMENT SURGERY     2000  . BREAST RECONSTRUCTION WITH PLACEMENT OF TISSUE EXPANDER AND FLEX HD (ACELLULAR HYDRATED DERMIS) Left 03/09/2015   Procedure: LEFT BREAST RECONSTRUCTION WITH PLACEMENT OF TISSUE EXPANDER AND POSSIBLE ACELLULAR DERMIS;  Surgeon: Irene Limbo, MD;  Location: [facility];  Service: Plastics;  Laterality: Left;  .  BREAST SURGERY     biopsy  . CESAREAN SECTION     x 2  . LIPOSUCTION WITH LIPOFILLING Left 06/05/2015   Procedure: LIPOSUCTION WITH LIPOFILLING TO LEFT CHEST;  Surgeon: Irene Limbo, MD;  Location: [facility];  Service: Plastics;  Laterality: Left;  Marland Kitchen MASTECTOMY W/ SENTINEL NODE BIOPSY Left 03/09/2015   Procedure: LEFT MASTECTOMY WITH SENTINEL LYMPH NODE MAPPING;  Surgeon: Autumn Messing III, MD;  Location: [facility];  Service: General;  Laterality: Left;  . REMOVAL OF TISSUE EXPANDER AND PLACEMENT OF IMPLANT Left 06/05/2015   Procedure: REMOVAL OF LEFT TISSUE EXPANDER AND PLACEMENT OF SALINE IMPLANT;  Surgeon: Irene Limbo, MD;  Location: [facility];  Service: Plastics;  Laterality: Left;  . TONSILLECTOMY    . WISDOM TOOTH EXTRACTION      SOCIAL HISTORY: Social History   Social History  . Marital status: Married    Spouse name: N/A  . Number of children: 2  . Years of education: N/A   Occupational History  . Not on file.   Social History Main Topics  . Smoking status: Former Smoker    Packs/day: 1.00    Years: 30.00    Types: Cigarettes    Quit date: 02/02/2015  . Smokeless tobacco: Never Used  . Alcohol use No  . Drug use: No  . Sexual activity: Yes    Birth control/ protection: Surgical   Other Topics Concern  . Not on file   Social History Narrative  . No narrative on file    FAMILY HISTORY: Family History  Problem Relation Age of Onset  . Breast cancer Maternal Aunt 59  . Breast cancer Maternal Aunt 83  . Heart disease Father   . Heart disease Maternal Uncle   . Pancreatic cancer Paternal Aunt 83  . Heart disease Paternal Uncle   . Alzheimer's disease Maternal Grandmother   . Heart disease Maternal Grandfather   . Heart disease Paternal Grandmother   . Heart disease Paternal Grandfather   . Breast cancer Cousin 12  . Breast cancer Cousin 23    Maternal cousin's daughter  . Breast cancer  60  . Heart disease Other   .  Prostate cancer Cousin     Maternal first cousin  . Ovarian cancer Other     Mother's maternal grandmother    ALLERGIES:  is allergic to penicillins.  MEDICATIONS:  Current Outpatient Prescriptions  Medication Sig Dispense Refill  . acyclovir (ZOVIRAX) 800 MG tablet Take 800 mg by mouth daily.    . cholecalciferol 4000 units TABS Take 1,000 Units by mouth daily.    . Cyanocobalamin 1000 MCG/15ML LIQD 5000 micrograms daily 450 mL   . Investigational palbociclib (IBRANCE) 125 MG capsule [facility] AFT-05 PALLAS Take 1 capsule (125 mg total) by mouth daily. Take with food. Swallow whole. Do not chew. Take on  days 1-21. Repeat every 28 days. 69 capsule 0  . Multiple Vitamin (MULTIVITAMIN WITH MINERALS) TABS tablet Take 1 tablet by mouth daily.    . tamoxifen (NOLVADEX) 20 MG tablet TAKE 1 TABLET BY MOUTH EVERY DAY 30 tablet 0   No current facility-administered medications for this visit.     REVIEW OF SYSTEMS:   Constitutional: Denies fevers, chills or abnormal night sweats Eyes: Denies blurriness of vision, double vision or watery eyes Ears, nose, mouth, throat, and face: Denies mucositis or sore throat Respiratory: Denies cough, dyspnea or wheezes Cardiovascular: Denies palpitation, chest discomfort or lower extremity swelling Gastrointestinal:  Denies nausea, heartburn or change in bowel habits Skin: Denies abnormal skin rashes Lymphatics: Denies new lymphadenopathy or easy bruising Neurological:Denies numbness, tingling or new weaknesses Behavioral/Psych: Mood is stable, no new changes  Breast:  Denies any palpable lumps or discharge All other systems were reviewed with the patient and are negative.  PHYSICAL EXAMINATION: ECOG PERFORMANCE STATUS: 1 - Symptomatic but completely ambulatory  Vitals:   12/07/15 0957  BP: 114/75  Pulse: 76  Resp: 15  Temp: 98.4 F (36.9 C)   Filed Weights   12/07/15 0957  Weight: 127 lb 6.4 oz (57.8 kg)    GENERAL:alert, no  distress and comfortable SKIN: skin color, texture, turgor are normal, no rashes or significant lesions EYES: normal, conjunctiva are pink and non-injected, sclera clear OROPHARYNX:no exudate, no erythema and lips, buccal mucosa, and tongue normal  NECK: supple, thyroid normal size, non-tender, without nodularity LYMPH:  no palpable lymphadenopathy in the cervical, axillary or inguinal LUNGS: clear to auscultation and percussion with normal breathing effort HEART: regular rate & rhythm and no murmurs and no lower extremity edema ABDOMEN:abdomen soft, non-tender and normal bowel sounds Musculoskeletal:no cyanosis of digits and no clubbing  PSYCH: alert & oriented x 3 with fluent speech NEURO: no focal motor/sensory deficits BREAST: No palpable lumps or nodules in bilateral breasts left breast is been reconstructed (exam performed in the presence of a chaperone)   LABORATORY DATA:  I have reviewed the data as listed Lab Results  Component Value Date   WBC 7.9 12/07/2015   HGB 12.9 12/07/2015   HCT 38.9 12/07/2015   MCV 94.0 12/07/2015   PLT 210 12/07/2015   Lab Results  Component Value Date   NA 144 10/12/2015   K 4.8 10/12/2015   CO2 25 10/12/2015    RADIOGRAPHIC STUDIES: I have personally reviewed the radiological reports and agreed with the findings in the report.  ASSESSMENT AND PLAN:  Breast cancer of lower-outer quadrant of left female breast ([place]) Left breast mastectomy 03/09/2015 showed grade 1 invasive ductal carcinoma, 4 cm, 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 Pathologic staging: T2 N0 stage II a Oncotype DX score: 9, low risk, 6% risk of recurrence Genetic testing: No mutations identified, VUS PMS-2 Breast reconstruction  -------------------------------------------------------------------------------------------------- Current treatment: Tamoxifen 20 mg  daily started March 2017 (premenopausal based on blood work showing FSH5.3) Clinical trial: PALLAS and enrolled 08/10/2015, patient randomized to [facility] Current dose: 125 mg daily  Tamoxifen toxicities: 1. Occasional night sweats due to tamoxifen 2. ankle pain which has resolved  Ibrance toxicities: 1. Mild anemia: Due to Ibrance  2. fatigue: Moderate 3. Lack of drive 4. Feeling of blues/depression  Patient still continues to exercise 4 miles every day and lift weights. I encouraged her to continue with the same. In spite of tolerating the  treatment reasonably well, patient wants to withdraw from the study. I had reached out to the principal investigator on the study Dr. Sherrian Divers who suggested that we counseled her about reducing the dosage of her treatment.Patient did not want to take the Ibrance anymore even with dose reduction.  Weight gain: Patient's BMI is normal. She is still very much focused on the recent weight gain.  Return to clinic in 6 months for follow-up Patient will have mammograms in January 2018.  All questions were answered. The patient knows to call the clinic with any problems, questions or concerns.    Rulon Eisenmenger, MD 12/07/15

## 2015-12-07 NOTE — Assessment & Plan Note (Signed)
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 Pathologic staging: T2 N0 stage II a Oncotype DX score: 9, low risk, 6% risk of recurrence Genetic testing: No mutations identified, VUS PMS-2 Breast reconstruction  -------------------------------------------------------------------------------------------------- Current treatment: Tamoxifen 20 mg daily started March 2017 (premenopausal based on blood work showing FSH5.3) Clinical trial: PALLAS and enrolled 08/10/2015, patient randomized to [facility] Current dose: 125 mg daily  Tamoxifen toxicities: 1. Occasional night sweats due to tamoxifen 2. ankle pain which has resolved  Ibrance toxicities: 1. Mild anemia: Due to Ibrance  2. mild fatigue: Mild Patient still continues to exercise 4 miles every day and lift weights. I encouraged her to continue with the same. In spite of tolerating the treatment reasonably well, patient wants to withdraw from the study. I had reached out to the principal investigator on the study Dr. Sherrian Divers who suggested that we counseled her about reducing the dosage of her treatment.  Weight gain: Patient's BMI is normal. She is still very much focused on the recent weight gain.  Return to clinic in

## 2015-12-08 ENCOUNTER — Encounter: Payer: Self-pay | Admitting: Medical Oncology

## 2015-12-08 DIAGNOSIS — Z170 Estrogen receptor positive status [ER+]: Principal | ICD-10-CM

## 2015-12-08 DIAGNOSIS — C50512 Malignant neoplasm of lower-outer quadrant of left female breast: Secondary | ICD-10-CM

## 2015-12-08 NOTE — Progress Notes (Signed)
PALLAS Patient's spouse dropped off study dispensed palbociclib bottles for cycles 3, 4 and 5, along with medication diaries for each cycle. Bottle # [id number], cycle 3 returned empty and patient reports no missed days for palbociclib and anti hormone medication in her medication diaries.  Bottle # [id number], cycle 4 returned unopened. Patient did not start this cycle.  Bottle # [id number], cycle 5 returned unopened.  Per patient, she has continued to take her anti hormone medication, tamoxifen, as instructed by Dr. Lindi Adie.  All three bottles provided to PharmD Kennith Center for documentation of return. I called patient to inform her that I have received all three bottles and her medication diaries and to thank her. Patient denies any questions and this time and was encouraged to call with questions.  EOT entered in study IRT 12/06/2016. Adele Dan, RN, BSN Clinical Research 12/08/2015 4:44 PM

## 2015-12-18 ENCOUNTER — Encounter (HOSPITAL_COMMUNITY): Payer: Federal, State, Local not specified - PPO

## 2015-12-29 ENCOUNTER — Encounter (HOSPITAL_COMMUNITY)
Admission: RE | Admit: 2015-12-29 | Discharge: 2015-12-29 | Disposition: A | Discharge status: Home / Self Care | Payer: Federal, State, Local not specified - PPO | Source: Ambulatory Visit | Attending: Hematology and Oncology | Admitting: Hematology and Oncology

## 2015-12-29 DIAGNOSIS — Z170 Estrogen receptor positive status [ER+]: Secondary | ICD-10-CM | POA: Insufficient documentation

## 2015-12-29 DIAGNOSIS — C50512 Malignant neoplasm of lower-outer quadrant of left female breast: Secondary | ICD-10-CM | POA: Diagnosis present

## 2015-12-29 MED ORDER — TECHNETIUM TC 99M MEDRONATE IV KIT
25.0000 | PACK | Freq: Once | INTRAVENOUS | Status: AC | PRN
  Administered 2015-12-29: 21.8 via INTRAVENOUS

## 2015-12-30 ENCOUNTER — Telehealth: Payer: Self-pay | Admitting: Medical Oncology

## 2015-12-30 ENCOUNTER — Telehealth: Payer: Self-pay

## 2015-12-30 NOTE — Telephone Encounter (Signed)
Called pt after discussing with Dr.Gudena regarding pt normal bone scans and pt still having low back pain. Pt wanting to try alternative therapies and would like approval from [facility]. Ok to proceed with trying acupuncture or chiropractic care to help with pain management if pt wants. Pt appreciative and will call with any more concerns or questions.

## 2015-12-30 NOTE — Telephone Encounter (Signed)
Patient called concerned that she had not heard anything back regarding her bone scan yesterday. Patient asking how long does it take for results to come back. I checked status of bone scan and results, informed patient that results came back with "Normal bone scan.  No evidence for metastatic bone disease." Patient expressed relief and thanks for giving her the results. Patient states she continues to have ache/pain in her left lower back and asking what Dr. Lindi Adie recommends for her to do. Patient states she's tried having another massage, as well as muscle relaxer's and states she has had no relief. Informed patient I will forward message to Dr. Lindi Adie to review and his nurse will follow-up with her. Patient thanked me for assistance with this. Patient encouraged to contact Dr. Lindi Adie or myself with any questions/concerns she may have.  Adele Dan, RN, BSN Clinical Research  12/30/2015 1:41 PM

## 2015-12-31 ENCOUNTER — Ambulatory Visit: Payer: Federal, State, Local not specified - PPO | Admitting: Hematology and Oncology

## 2015-12-31 ENCOUNTER — Other Ambulatory Visit: Payer: Federal, State, Local not specified - PPO

## 2015-12-31 ENCOUNTER — Encounter: Payer: Self-pay | Admitting: Medical Oncology

## 2015-12-31 DIAGNOSIS — Z170 Estrogen receptor positive status [ER+]: Principal | ICD-10-CM

## 2015-12-31 DIAGNOSIS — C50512 Malignant neoplasm of lower-outer quadrant of left female breast: Secondary | ICD-10-CM

## 2016-01-08 ENCOUNTER — Other Ambulatory Visit: Payer: Self-pay | Admitting: Emergency Medicine

## 2016-01-08 DIAGNOSIS — C50512 Malignant neoplasm of lower-outer quadrant of left female breast: Secondary | ICD-10-CM

## 2016-01-08 MED ORDER — TAMOXIFEN CITRATE 20 MG PO TABS: 20.0000 mg | ORAL_TABLET | Freq: Every day | ORAL | 3 refills | Status: DC

## 2016-02-09 ENCOUNTER — Other Ambulatory Visit: Payer: Self-pay | Admitting: Hematology and Oncology

## 2016-02-09 DIAGNOSIS — Z9012 Acquired absence of left breast and nipple: Secondary | ICD-10-CM

## 2016-02-29 ENCOUNTER — Other Ambulatory Visit: Payer: Self-pay | Admitting: Hematology and Oncology

## 2016-02-29 ENCOUNTER — Ambulatory Visit
Admission: RE | Admit: 2016-02-29 | Discharge: 2016-02-29 | Disposition: A | Discharge status: Home / Self Care | Payer: Federal, State, Local not specified - PPO | Source: Ambulatory Visit | Attending: Hematology and Oncology | Admitting: Hematology and Oncology

## 2016-02-29 DIAGNOSIS — Z9012 Acquired absence of left breast and nipple: Secondary | ICD-10-CM

## 2016-05-04 ENCOUNTER — Other Ambulatory Visit: Payer: Self-pay | Admitting: Medical Oncology

## 2016-05-27 ENCOUNTER — Other Ambulatory Visit: Payer: Self-pay | Admitting: Emergency Medicine

## 2016-05-27 DIAGNOSIS — C50512 Malignant neoplasm of lower-outer quadrant of left female breast: Secondary | ICD-10-CM

## 2016-05-27 DIAGNOSIS — Z170 Estrogen receptor positive status [ER+]: Principal | ICD-10-CM

## 2016-05-30 ENCOUNTER — Telehealth: Payer: Self-pay | Admitting: Medical Oncology

## 2016-05-30 ENCOUNTER — Telehealth: Payer: Self-pay | Admitting: *Deleted

## 2016-05-30 ENCOUNTER — Other Ambulatory Visit: Payer: Federal, State, Local not specified - PPO

## 2016-05-30 ENCOUNTER — Ambulatory Visit: Payer: Federal, State, Local not specified - PPO | Admitting: Hematology and Oncology

## 2016-05-30 NOTE — Assessment & Plan Note (Deleted)
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 Pathologic staging: T2 N0 stage II a Oncotype DX score: 9, low risk, 6% risk of recurrence Genetic testing: No mutations identified, VUS PMS-2 Breast reconstruction  -------------------------------------------------------------------------------------------------- Current treatment: Tamoxifen 20 mg daily started March 2017 (premenopausal based on blood work showing FSH5.3) Clinical trial: PALLAS and enrolled 08/10/2015, patient was randomized to [facility]; withdrew consent 12/07/2015 (not for toxicities)  Tamoxifen toxicities: Occasional night sweats due to tamoxifen  Surveillance: 1. Breast exam 05/30/2016: Benign 2. mammogram 03/01/2016: No mammographic evidence of malignancy  Return to clinic in 1 year for follow-up

## 2016-05-30 NOTE — Telephone Encounter (Signed)
PALLAS Patient with scheduled appointment to see Dr. Lindi Adie today for routine follow-up. I was going to see patient today while in clinic and touch base with her regarding 6 months follow-up on study and review any ongoing AE's related to while patient on study. Patient's appointment at 10:30 this morning and did not show. Attempted to call patient at the number listed in epic, I received a voice message that was not the patient. I did not leave a message. Dr. Lindi Adie informed of inability to contact patient. Desk nurse called patient's spouse and LVMOM at his number.  Adele Dan, RN, BSN Clinical Research 05/30/2016 11:20 AM

## 2016-05-30 NOTE — Telephone Encounter (Signed)
This RN called patient's husband to see if she was coming today for her appointments with Dr. Lindi Adie. Research nurse is looking for her Rubin Payor). Instructed him to call [facility] and let us know.

## 2016-05-31 ENCOUNTER — Telehealth: Payer: Self-pay | Admitting: Medical Oncology

## 2016-05-31 NOTE — Telephone Encounter (Signed)
I was informed this morning by Caryl Pina, the [facility] operator, that patient called yesterday to cancel her appointment with Dr. Lindi Adie and to reschedule it for Friday, May 25th. Patient with updated contact number.

## 2016-06-03 ENCOUNTER — Encounter: Payer: Self-pay | Admitting: Hematology and Oncology

## 2016-06-03 ENCOUNTER — Ambulatory Visit (HOSPITAL_BASED_OUTPATIENT_CLINIC_OR_DEPARTMENT_OTHER): Payer: Federal, State, Local not specified - PPO | Admitting: Hematology and Oncology

## 2016-06-03 ENCOUNTER — Encounter: Payer: Self-pay | Admitting: Medical Oncology

## 2016-06-03 ENCOUNTER — Other Ambulatory Visit (HOSPITAL_BASED_OUTPATIENT_CLINIC_OR_DEPARTMENT_OTHER): Payer: Federal, State, Local not specified - PPO

## 2016-06-03 DIAGNOSIS — Z170 Estrogen receptor positive status [ER+]: Principal | ICD-10-CM

## 2016-06-03 DIAGNOSIS — Z79810 Long term (current) use of selective estrogen receptor modulators (SERMs): Secondary | ICD-10-CM

## 2016-06-03 DIAGNOSIS — C50512 Malignant neoplasm of lower-outer quadrant of left female breast: Secondary | ICD-10-CM

## 2016-06-03 LAB — CBC WITH DIFFERENTIAL/PLATELET
BASO%: 0.5 % (ref 0.0–2.0)
BASOS ABS: 0 10*3/uL (ref 0.0–0.1)
EOS%: 1.5 % (ref 0.0–7.0)
Eosinophils Absolute: 0.1 10*3/uL (ref 0.0–0.5)
HCT: 35.3 % (ref 34.8–46.6)
HEMOGLOBIN: 12 g/dL (ref 11.6–15.9)
LYMPH%: 27.5 % (ref 14.0–49.7)
MCH: 29.6 pg (ref 25.1–34.0)
MCHC: 34 g/dL (ref 31.5–36.0)
MCV: 87.2 fL (ref 79.5–101.0)
MONO#: 0.7 10*3/uL (ref 0.1–0.9)
MONO%: 8.1 % (ref 0.0–14.0)
NEUT%: 62.4 % (ref 38.4–76.8)
NEUTROS ABS: 5.4 10*3/uL (ref 1.5–6.5)
PLATELETS: 220 10*3/uL (ref 145–400)
RBC: 4.05 10*6/uL (ref 3.70–5.45)
RDW: 13.4 % (ref 11.2–14.5)
WBC: 8.6 10*3/uL (ref 3.9–10.3)
lymph#: 2.4 10*3/uL (ref 0.9–3.3)

## 2016-06-03 LAB — COMPREHENSIVE METABOLIC PANEL
ALBUMIN: 4 g/dL (ref 3.5–5.0)
ALK PHOS: 46 U/L (ref 40–150)
ALT: 9 U/L (ref 0–55)
ANION GAP: 7 meq/L (ref 3–11)
AST: 15 U/L (ref 5–34)
BILIRUBIN TOTAL: 0.23 mg/dL (ref 0.20–1.20)
BUN: 9.2 mg/dL (ref 7.0–26.0)
CO2: 24 mEq/L (ref 22–29)
Calcium: 9.5 mg/dL (ref 8.4–10.4)
Chloride: 108 mEq/L (ref 98–109)
Creatinine: 0.8 mg/dL (ref 0.6–1.1)
EGFR: 87 mL/min/{1.73_m2} — AB (ref >90)
Glucose: 97 mg/dl (ref 70–140)
POTASSIUM: 4.4 meq/L (ref 3.5–5.1)
Sodium: 139 mEq/L (ref 136–145)
TOTAL PROTEIN: 6.8 g/dL (ref 6.4–8.3)

## 2016-06-03 MED ORDER — TAMOXIFEN CITRATE 20 MG PO TABS: 20.0000 mg | ORAL_TABLET | Freq: Every day | ORAL | 3 refills | Status: DC

## 2016-06-03 NOTE — Assessment & Plan Note (Signed)
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 Pathologic staging: T2 N0 stage II a Oncotype DX score: 9, low risk, 6% risk of recurrence Genetic testing: No mutations identified, VUS PMS-2 Breast reconstruction  -------------------------------------------------------------------------------------------------- Current treatment: Tamoxifen 20 mg daily started March 2017 (premenopausal based on blood work showing FSH5.3) Clinical trial: PALLAS and enrolled 08/10/2015, patient randomized to [place], discontinued 12/07/2015 due to fatigue and lack of drive  Tamoxifen toxicities: 1. Occasional night sweats due to tamoxifen 2. ankle pain which has resolved  Return to clinic in 1 year for follow-up

## 2016-06-03 NOTE — Progress Notes (Signed)
PALLAS: Early end of treatment 32-monthFollow-up  Patient in clinic alone today for scheduled follow-up with Dr. GLindi Adie I met with patient in exam room after her lab appointment and v/s completed. Patient reports to be feeling great and reports complete resolution to her myalgia, ending approximately in February. Patient states that her back ache is ongoing despite trying treatments such as stretching, yoga, or massages. Patient also reports that she continues to power walk and exercise without difficulty and is feeling like her "normal self" again. Concomitant medications reviewed and noted, patient reports no new medications. Confirmed with patient that she continues to agree to continued follow-up for study. All patient's questions answered to her satisfaction and patient thanked for her time and continued support of study. Patient encouraged to call Dr. GLindi Adieor myself with any questions or concerns she may have. Patient informed that next clinic appointment would be in approximately 6 months from today's.    AMANMEET [id number] 06/03/2016  Adverse Event Log  Study/Protocol: PALLAS Cycle: 6 month Follow-up  Event Grade Onset Date Resolved Date Palbociclib Tamoxifen Neither Treatment Comments  Myalgia 1 ~10/2015 ~ Feb.  2018 No Probable No None   Back pain- left lower 1 ~10/2015 ongoing No Probable No See nurse note.     LAdele Dan RN, BSN Clinical Research 06/03/2016 11:48 AM

## 2016-06-03 NOTE — Progress Notes (Signed)
Patient Care Team: [name], [name], MD as PCP - General (Family Medicine) Truitt Merle, MD as Consulting Physician (Hematology) Jovita Kussmaul, MD as Consulting Physician (General Surgery)  DIAGNOSIS:  Encounter Diagnosis  Name Primary?  . Malignant neoplasm of lower-outer quadrant of left breast of female, estrogen receptor positive ([place])     SUMMARY OF ONCOLOGIC HISTORY: Oncology History   Breast cancer of lower-outer quadrant of left female breast ([place])   Staging form: Breast, AJCC 7th Edition     Clinical: Stage IIB (T3, N0, M0) - Unsigned     Pathologic stage from 03/09/2015: Stage IIA (T2, N0, cM0) - Signed by Truitt Merle, MD on 03/30/2015       Breast cancer of lower-outer quadrant of left female breast ([place])   01/27/2015 Mammogram    diagnostic mammogram and ultrasound showed architectural distortion within the left outer breast, 3:00 position. Area of biopsied architectural distortion measures approx 2.2 x 1.8 x 1.4 cm.       01/29/2015 Initial Biopsy    (L) breast mass biopsy showed low-grade invasive ductal carcinoma & DCIS; ER+ (95%), PR+ (80%), HER2 neg (ratio 1.22), Ki67 3%.       02/06/2015 Breast MRI    Non-masslike enhancement measuring 1.4 x 6.3 x 3.9 cm at the left breast 3 to 5:00 position. Enhancement extends to skin laterally with mild enhancement of adjacent skin, tumor extension in skin not excluded. No adenopathy. Bilat implants noted       03/09/2015 Surgery    (L) breast simple mastectomy with SLNB/immediate reconstruction (Toth/Thimmappa): IDC, 4 cm, grade 1, extensive grade 2 DCIS with associated calcs; margins negative. 0/4 SLN. HER2 repeated, remains negative (ratio 1.12).  pT2,pN0: Stage IIA      03/09/2015 Oncotype testing    Recurrence score 9; 6% ROR (low risk)      03/25/2015 -  Anti-estrogen oral therapy    Tamoxifen 20 mg daily      05/11/2015 Miscellaneous    Genetic testing. 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 Genes evaluated: ATM, BARD1,  BRCA1, BRCA2, BRIP1, CDH1, CHEK2, EPCAM, FANCC, MLH1, MSH2, MSH6, NBN, PALB2, PMS2, PTEN, RAD51C, RAD51D, TP53, & XRCC2.       06/06/2015 Surgery    Removal of left breast tissue expander and placement of saline implant (Thimmappa)      08/17/2015 Miscellaneous    PALLAS Trial: Randomized to Ibrance arm.  Started Cycle #1 08/17/15; discontinued from this study due to lack of drive, fatigue and myalgias       CHIEF COMPLIANT: Follow-up after discontinuation of Palbociclib, currently on tamoxifen  INTERVAL HISTORY: Joann Collins is a 51 year old with above-mentioned history of left breast cancer who was on clinical trial with Palbociclib. After few months on the trial she decided to discontinue it because of decrease in energy levels and fatigue. Since she stopped the trial, her symptoms have resolved. She feels back to her normal self. She has lost some weight and feels very happy about it. She does not seem to have any trouble taking tamoxifen. She denies any hot flashes or myalgias. Occasionally she has paresthesias on her right leg but those aren't don't seem to be too concerning.  REVIEW OF SYSTEMS:   Constitutional: Denies fevers, chills or abnormal weight loss Eyes: Denies blurriness of vision Ears, nose, mouth, throat, and face: Denies mucositis or sore throat Respiratory: Denies cough, dyspnea or wheezes Cardiovascular: Denies palpitation, chest discomfort Gastrointestinal:  Denies nausea, heartburn or change in bowel habits Skin:  Denies abnormal skin rashes Lymphatics: Denies new lymphadenopathy or easy bruising Neurological:Denies numbness, tingling or new weaknesses Behavioral/Psych: Mood is stable, no new changes  Extremities: No lower extremity edema  All other systems were reviewed with the patient and are negative.  I have reviewed the past medical history, past surgical history, social history and family history with the patient and they are unchanged from previous  note.  ALLERGIES:  is allergic to penicillins.  MEDICATIONS:  Current Outpatient Prescriptions  Medication Sig Dispense Refill  . acyclovir (ZOVIRAX) 800 MG tablet Take 800 mg by mouth daily.    . cholecalciferol 4000 units TABS Take 1,000 Units by mouth daily.    . Cyanocobalamin 1000 MCG/15ML LIQD 5000 micrograms daily 450 mL   . Investigational palbociclib (IBRANCE) 125 MG capsule [facility] AFT-05 PALLAS Take 1 capsule (125 mg total) by mouth daily. Take with food. Swallow whole. Do not chew. Take on days 1-21. Repeat every 28 days. (Patient not taking: Reported on 12/31/2015) 69 capsule 0  . Multiple Vitamin (MULTIVITAMIN WITH MINERALS) TABS tablet Take 1 tablet by mouth daily.    . tamoxifen (NOLVADEX) 20 MG tablet Take 1 tablet (20 mg total) by mouth daily. 90 tablet 3   No current facility-administered medications for this visit.     PHYSICAL EXAMINATION: ECOG PERFORMANCE STATUS: 0 - Asymptomatic  Vitals:   06/03/16 1100  BP: 109/79  Pulse: 88  Resp: 18  Temp: 98.3 F (36.8 C)   Filed Weights   06/03/16 1100  Weight: 118 lb 14.4 oz (53.9 kg)    GENERAL:alert, no distress and comfortable SKIN: skin color, texture, turgor are normal, no rashes or significant lesions EYES: normal, Conjunctiva are pink and non-injected, sclera clear OROPHARYNX:no exudate, no erythema and lips, buccal mucosa, and tongue normal  NECK: supple, thyroid normal size, non-tender, without nodularity LYMPH:  no palpable lymphadenopathy in the cervical, axillary or inguinal LUNGS: clear to auscultation and percussion with normal breathing effort HEART: regular rate & rhythm and no murmurs and no lower extremity edema ABDOMEN:abdomen soft, non-tender and normal bowel sounds MUSCULOSKELETAL:no cyanosis of digits and no clubbing  NEURO: alert & oriented x 3 with fluent speech, no focal motor/sensory deficits EXTREMITIES: No lower extremity edema  LABORATORY DATA:  I have reviewed the  data as listed   Chemistry      Component Value Date/Time   NA 139 06/03/2016 1045   K 4.4 06/03/2016 1045   CO2 24 06/03/2016 1045   BUN 9.2 06/03/2016 1045   CREATININE 0.8 06/03/2016 1045      Component Value Date/Time   CALCIUM 9.5 06/03/2016 1045   ALKPHOS 46 06/03/2016 1045   AST 15 06/03/2016 1045   ALT 9 06/03/2016 1045   BILITOT 0.23 06/03/2016 1045       Lab Results  Component Value Date   WBC 8.6 06/03/2016   HGB 12.0 06/03/2016   HCT 35.3 06/03/2016   MCV 87.2 06/03/2016   PLT 220 06/03/2016   NEUTROABS 5.4 06/03/2016    ASSESSMENT & PLAN:  Breast cancer of lower-outer quadrant of left female breast ([place]) Left breast mastectomy 03/09/2015 showed grade 1 invasive ductal carcinoma, 4 cm, 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 Pathologic staging: T2 N0 stage II a Oncotype DX score: 9, low risk, 6% risk of recurrence Genetic testing: No mutations identified, VUS PMS-2 Breast reconstruction  -------------------------------------------------------------------------------------------------- Current treatment: Tamoxifen 20 mg daily  started March 2017 (premenopausal based on blood work showing [place].3) Clinical trial: PALLAS and enrolled 08/10/2015, patient randomized to [place], discontinued 12/07/2015 due to fatigue and lack of drive  Tamoxifen toxicities: 1. Occasional night sweats due to tamoxifen 2. Paresthesias on the right leg but they're not significant are bothering her.  Return to clinic in 1 year for follow-up  I spent 25 minutes talking to the patient of which more than half was spent in counseling and coordination of care.  No orders of the defined types were placed in this encounter.  The patient has a good understanding of the overall plan. she agrees with it. she will call with any problems that may develop before the next visit here.    Rulon Eisenmenger, MD 06/03/16

## 2016-06-04 ENCOUNTER — Telehealth: Payer: Self-pay | Admitting: Hematology and Oncology

## 2016-06-04 NOTE — Telephone Encounter (Signed)
Called to give appt 06/01/17 @ 3.30. No answer no vm. Mailed calendar.

## 2016-12-26 ENCOUNTER — Telehealth: Payer: Self-pay | Admitting: Medical Oncology

## 2016-12-26 NOTE — Telephone Encounter (Signed)
Call to patient to see how she is doing. Patient is in follow-up for [place]. Patient signed Withdrawal to Treat consent but has agreed to be followed for data.  Patient states that she is glad I called, and reports to be having "pain" to "rib cage near where mastectomy was." Patient reports ongoing since she noticed it around Thanksgiving (11/22). Patient wondering if she should come in and have it evaluated. I suggested to patient that she should have Dr. Lindi Adie assess. Informed patient will send him this phone message and will see about getting her an appointment to come in to clinic. Patient reports appointment would be best to occur prior to January, when she will be traveling a lot for work. Patient informed that either Dr. Geralyn Flash nurse or I will return call to her regarding appointment. Patient thanked me and knows to call me back with questions.  Adele Dan, RN, BSN Clinical Research 12/26/2016 1:47 PM

## 2016-12-26 NOTE — Telephone Encounter (Signed)
Pt scheduled. Pt aware of time/date. Thank you.

## 2016-12-27 NOTE — Assessment & Plan Note (Signed)
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 Pathologic staging: T2 N0 stage II a Oncotype DX score: 9, low risk, 6% risk of recurrence Genetic testing: No mutations identified, VUS PMS-2 Breast reconstruction  -------------------------------------------------------------------------------------------------- Current treatment: Tamoxifen 20 mg daily started March 2017 (premenopausal based on blood work showing FSH5.3) Clinical trial: PALLAS and enrolled 08/10/2015, patient randomized to [place], discontinued 12/07/2015 due to fatigue and lack of drive  Tamoxifen toxicities: 1. Occasional night sweats due to tamoxifen 2. ankle pain which has resolved  Breast Cancer Surveillance: 1. Breast Exam: 12/27/16 2. Mammograms 02/29/16 Return to clinic in 1 year for follow-up

## 2016-12-28 ENCOUNTER — Ambulatory Visit: Payer: Federal, State, Local not specified - PPO | Admitting: Hematology and Oncology

## 2016-12-28 ENCOUNTER — Encounter: Payer: Self-pay | Admitting: Medical Oncology

## 2016-12-28 DIAGNOSIS — Z170 Estrogen receptor positive status [ER+]: Secondary | ICD-10-CM | POA: Diagnosis not present

## 2016-12-28 DIAGNOSIS — C50512 Malignant neoplasm of lower-outer quadrant of left female breast: Secondary | ICD-10-CM | POA: Diagnosis not present

## 2016-12-28 DIAGNOSIS — Z79810 Long term (current) use of selective estrogen receptor modulators (SERMs): Secondary | ICD-10-CM

## 2016-12-28 DIAGNOSIS — R0789 Other chest pain: Secondary | ICD-10-CM | POA: Diagnosis not present

## 2016-12-28 DIAGNOSIS — Z9012 Acquired absence of left breast and nipple: Secondary | ICD-10-CM | POA: Diagnosis not present

## 2016-12-28 MED ORDER — TAMOXIFEN CITRATE 20 MG PO TABS: 20.0000 mg | ORAL_TABLET | Freq: Every day | ORAL | 3 refills | Status: DC

## 2016-12-28 NOTE — Progress Notes (Signed)
Patient Care Team: [name], [name], MD as PCP - General (Family Medicine) Truitt Merle, MD as Consulting Physician (Hematology) Jovita Kussmaul, MD as Consulting Physician (General Surgery)  DIAGNOSIS:  Encounter Diagnosis  Name Primary?  . Malignant neoplasm of lower-outer quadrant of left breast of female, estrogen receptor positive ([place])     SUMMARY OF ONCOLOGIC HISTORY: Oncology History   Breast cancer of lower-outer quadrant of left female breast ([place])   Staging form: Breast, AJCC 7th Edition     Clinical: Stage IIB (T3, N0, M0) - Unsigned     Pathologic stage from 03/09/2015: Stage IIA (T2, N0, cM0) - Signed by Truitt Merle, MD on 03/30/2015       Breast cancer of lower-outer quadrant of left female breast ([place])   01/27/2015 Mammogram    diagnostic mammogram and ultrasound showed architectural distortion within the left outer breast, 3:00 position. Area of biopsied architectural distortion measures approx 2.2 x 1.8 x 1.4 cm.       01/29/2015 Initial Biopsy    (L) breast mass biopsy showed low-grade invasive ductal carcinoma & DCIS; ER+ (95%), PR+ (80%), HER2 neg (ratio 1.22), Ki67 3%.       02/06/2015 Breast MRI    Non-masslike enhancement measuring 1.4 x 6.3 x 3.9 cm at the left breast 3 to 5:00 position. Enhancement extends to skin laterally with mild enhancement of adjacent skin, tumor extension in skin not excluded. No adenopathy. Bilat implants noted       03/09/2015 Surgery    (L) breast simple mastectomy with SLNB/immediate reconstruction (Toth/Thimmappa): IDC, 4 cm, grade 1, extensive grade 2 DCIS with associated calcs; margins negative. 0/4 SLN. HER2 repeated, remains negative (ratio 1.12).  pT2,pN0: Stage IIA      03/09/2015 Oncotype testing    Recurrence score 9; 6% ROR (low risk)      03/25/2015 -  Anti-estrogen oral therapy    Tamoxifen 20 mg daily      05/11/2015 Miscellaneous    Genetic testing. 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 Genes evaluated: ATM, BARD1,  BRCA1, BRCA2, BRIP1, CDH1, CHEK2, EPCAM, FANCC, MLH1, MSH2, MSH6, NBN, PALB2, PMS2, PTEN, RAD51C, RAD51D, TP53, & XRCC2.       06/06/2015 Surgery    Removal of left breast tissue expander and placement of saline implant (Thimmappa)      08/17/2015 Miscellaneous    PALLAS Trial: Randomized to Ibrance arm.  Started Cycle #1 08/17/15; discontinued from this study due to lack of drive, fatigue and myalgias       CHIEF COMPLIANT: Worsening left chest wall and implanted breast pain  INTERVAL HISTORY: Joann Collins is a 51 year old with above-mentioned history of left breast cancer underwent left mastectomy and has an implant she is currently on tamoxifen and appears to be tolerating it fairly well.  She is complaining of left chest wall and axillary pain that radiates to the front of the chest this is post implant placement.  The pain at times is worse than other times.  But it is not going away so she decided to come and be checked out.  She does feel that the whole area is very sensitive to touch.  REVIEW OF SYSTEMS:   Constitutional: Denies fevers, chills or abnormal weight loss Eyes: Denies blurriness of vision Ears, nose, mouth, throat, and face: Denies mucositis or sore throat Respiratory: Denies cough, dyspnea or wheezes Cardiovascular: Denies palpitation, chest discomfort Gastrointestinal:  Denies nausea, heartburn or change in bowel habits Skin: Denies abnormal skin rashes  Lymphatics: Denies new lymphadenopathy or easy bruising Neurological:Denies numbness, tingling or new weaknesses Behavioral/Psych: Mood is stable, no new changes  Extremities: No lower extremity edema Breast:  denies any pain or lumps or nodules in either breasts All other systems were reviewed with the patient and are negative.  I have reviewed the past medical history, past surgical history, social history and family history with the patient and they are unchanged from previous note.  ALLERGIES:  is allergic  to penicillins.  MEDICATIONS:  Current Outpatient Medications  Medication Sig Dispense Refill  . acyclovir (ZOVIRAX) 800 MG tablet Take 800 mg by mouth daily.    . cholecalciferol 4000 units TABS Take 1,000 Units by mouth daily.    . Cyanocobalamin 1000 MCG/15ML LIQD 5000 micrograms daily 450 mL   . Multiple Vitamin (MULTIVITAMIN WITH MINERALS) TABS tablet Take 1 tablet by mouth daily.    . tamoxifen (NOLVADEX) 20 MG tablet Take 1 tablet (20 mg total) by mouth daily. 90 tablet 3   No current facility-administered medications for this visit.     PHYSICAL EXAMINATION: ECOG PERFORMANCE STATUS: 1 - Symptomatic but completely ambulatory  Vitals:   12/28/16 1041  BP: 106/71  Pulse: 74  Resp: 18  Temp: 98.6 F (37 C)  SpO2: 100%   Filed Weights   12/28/16 1041  Weight: 121 lb 1.6 oz (54.9 kg)    GENERAL:alert, no distress and comfortable SKIN: skin color, texture, turgor are normal, no rashes or significant lesions EYES: normal, Conjunctiva are pink and non-injected, sclera clear OROPHARYNX:no exudate, no erythema and lips, buccal mucosa, and tongue normal  NECK: supple, thyroid normal size, non-tender, without nodularity LYMPH:  no palpable lymphadenopathy in the cervical, axillary or inguinal LUNGS: clear to auscultation and percussion with normal breathing effort HEART: regular rate & rhythm and no murmurs and no lower extremity edema ABDOMEN:abdomen soft, non-tender and normal bowel sounds MUSCULOSKELETAL:no cyanosis of digits and no clubbing  NEURO: alert & oriented x 3 with fluent speech, no focal motor/sensory deficits EXTREMITIES: No lower extremity edema BREAST: Reconstructed left breast with tenderness in the axilla no palpable axillary supraclavicular or infraclavicular adenopathy no breast tenderness or nipple discharge. (exam performed in the presence of a chaperone)  LABORATORY DATA:  I have reviewed the data as listed   Chemistry      Component Value Date/Time    NA 139 06/03/2016 1045   K 4.4 06/03/2016 1045   CO2 24 06/03/2016 1045   BUN 9.2 06/03/2016 1045   CREATININE 0.8 06/03/2016 1045      Component Value Date/Time   CALCIUM 9.5 06/03/2016 1045   ALKPHOS 46 06/03/2016 1045   AST 15 06/03/2016 1045   ALT 9 06/03/2016 1045   BILITOT 0.23 06/03/2016 1045       Lab Results  Component Value Date   WBC 8.6 06/03/2016   HGB 12.0 06/03/2016   HCT 35.3 06/03/2016   MCV 87.2 06/03/2016   PLT 220 06/03/2016   NEUTROABS 5.4 06/03/2016    ASSESSMENT & PLAN:  Breast cancer of lower-outer quadrant of left female breast ([address]) Left breast mastectomy 03/09/2015 showed grade 1 invasive ductal carcinoma, 4 cm, 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 Pathologic staging: T2 N0 stage II a Oncotype DX score: 9, low risk, 6% risk of recurrence Genetic testing: No mutations identified, VUS PMS-2 Breast reconstruction  -------------------------------------------------------------------------------------------------- Current treatment: Tamoxifen 20 mg daily started March 2017 (  premenopausal based on blood work showing [place].3) Clinical trial: PALLAS and enrolled 08/10/2015, patient randomized to [place], discontinued 12/07/2015 due to fatigue and lack of drive  Tamoxifen toxicities: 1. Occasional night sweats due to tamoxifen 2. ankle pain which has resolved  Breast Cancer Surveillance: 1. Breast Exam: 12/27/16 2. Mammograms 02/29/16  Left axillary pain: We will get an ultrasound to evaluate this further.  Return to clinic in 1 year for follow-up    I spent 25 minutes talking to the patient of which more than half was spent in counseling and coordination of care.  Orders Placed This Encounter  Procedures  . US BREAST LTD UNI LEFT INC AXILLA    Standing Status:   Future    Standing Expiration Date:   02/28/2018    Order Specific  Question:   Reason for Exam (SYMPTOM  OR DIAGNOSIS REQUIRED)    Answer:   Left axillary pain with breast implants    Order Specific Question:   Preferred imaging location?    Answer:   [facility]  . MM DIAG BREAST TOMO UNI RIGHT    Standing Status:   Future    Standing Expiration Date:   12/28/2017    Order Specific Question:   Reason for Exam (SYMPTOM  OR DIAGNOSIS REQUIRED)    Answer:   Annual mammogram for breast cancer history in left breast S/P mastectomy    Order Specific Question:   Is the patient pregnant?    Answer:   No    Order Specific Question:   Preferred imaging location?    Answer:   [facility]   The patient has a good understanding of the overall plan. she agrees with it. she will call with any problems that may develop before the next visit here.   Harriette Ohara, MD 12/28/16

## 2016-12-28 NOTE — Progress Notes (Signed)
PALLAS: patient in data collection follow-up, and not on active study treatment. Patient in clinic today due to having pain to "rib area at mastectomy" site, and concerned due to it ongoing since approximately Thanksgiving, with no change in discomfort. I met with patient in exam room prior to her seeing Dr. Lindi Adie. Patient states that she has been doing well and reports her energy to be great and patient denies having any issues other than the reported issue for her visit today. Patient confirmed today that she continues to consent for data collection from her medical record for study. Patient also confirms to be taking her Tamoxifen as directed. Patient denied having any questions and was thanked for her time and continued support of study. Patient knows to call with questions or concerns. Adele Dan, RN, BSN Clinical Research 12/28/2016 2:58 PM

## 2016-12-30 ENCOUNTER — Telehealth: Payer: Self-pay | Admitting: Hematology and Oncology

## 2016-12-30 NOTE — Telephone Encounter (Signed)
Spoke to patient regarding upcoming December 2019 appointments.

## 2017-01-11 ENCOUNTER — Other Ambulatory Visit: Payer: Federal, State, Local not specified - PPO

## 2017-01-20 ENCOUNTER — Inpatient Hospital Stay: Admission: RE | Admit: 2017-01-20 | Payer: Federal, State, Local not specified - PPO | Source: Ambulatory Visit

## 2017-01-27 ENCOUNTER — Other Ambulatory Visit: Payer: Federal, State, Local not specified - PPO

## 2017-02-26 IMAGING — US US BREAST*L* LIMITED INC AXILLA
1 series · 8 of 8 positions shown · non-contrast
Comparison: No previous exams.

CLINICAL DATA: Palpable lump within the outer left breast.

EXAM:
DIGITAL DIAGNOSTIC BILATERAL MAMMOGRAM WITH IMPLANTS, 3D
TOMOSYNTHESIS AND CAD
ULTRASOUND LEFT BREAST
The patient has retropectoral saline implants. Standard and implant
displaced views were performed.

[Series 1: us breast*left* limited inc axilla · 0.06mm/px · 8 of 8 slices shown]
[im 1/8]
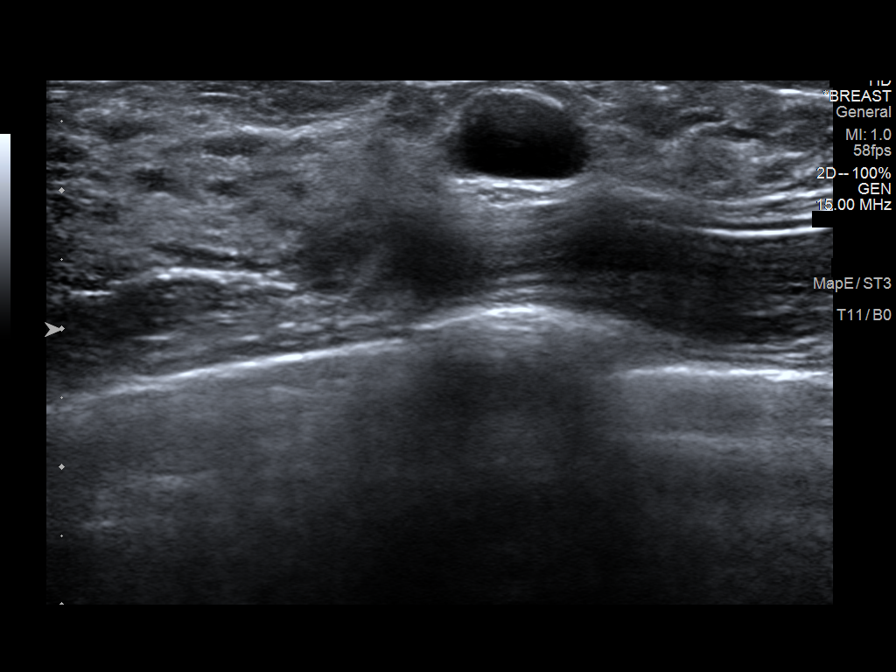
[im 2/8]
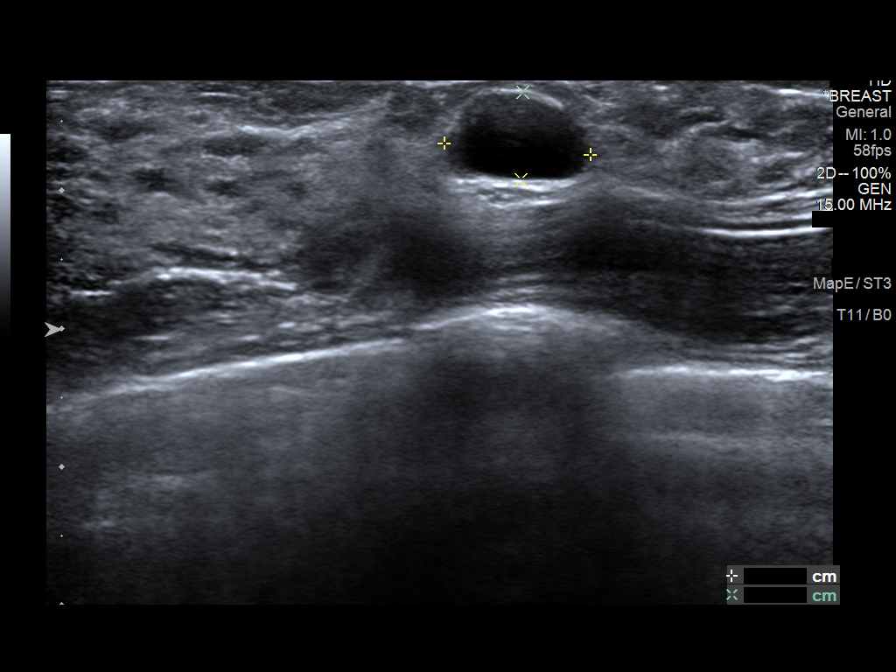
[im 3/8]
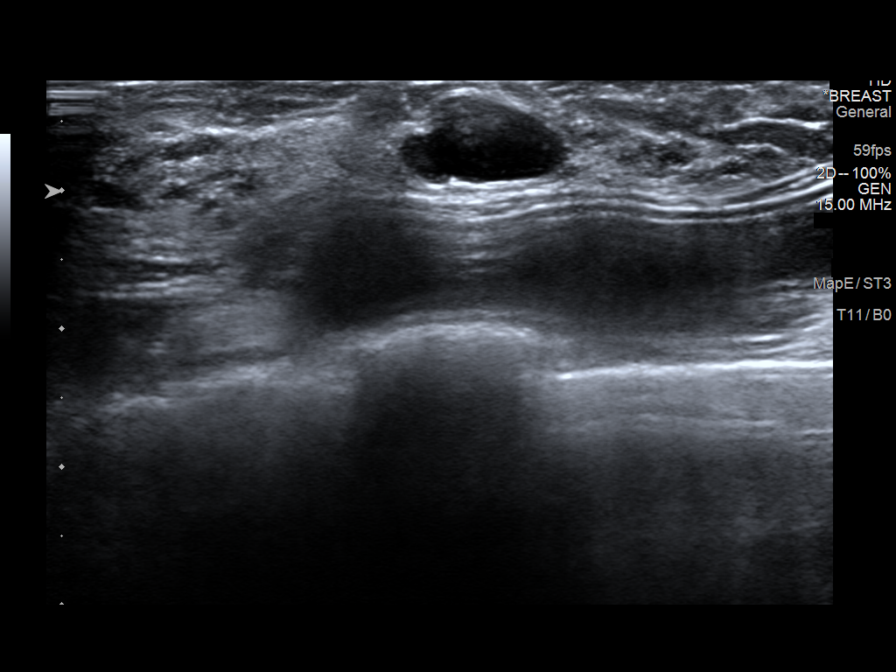
[im 4/8]
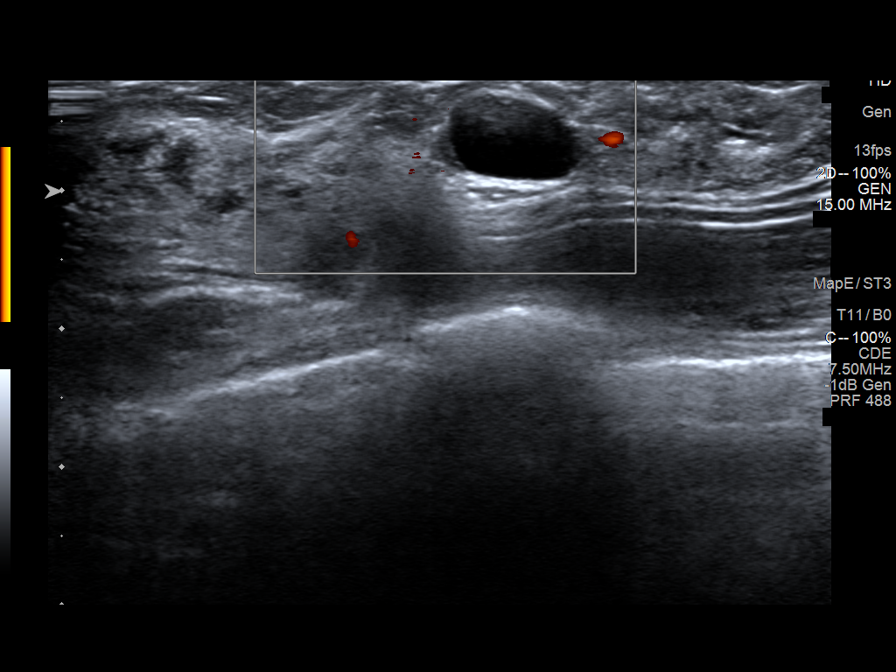
[im 5/8]
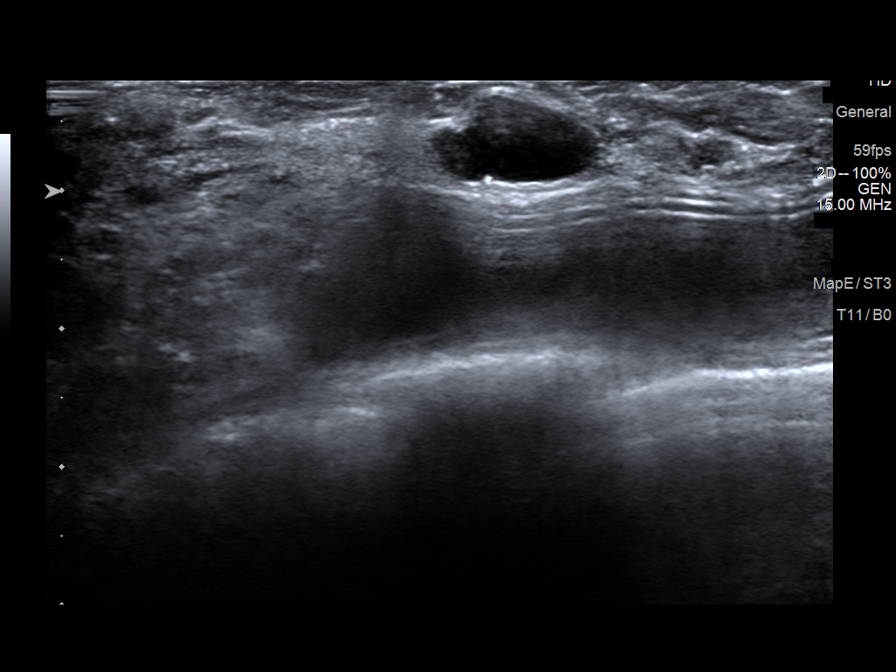
[im 6/8]
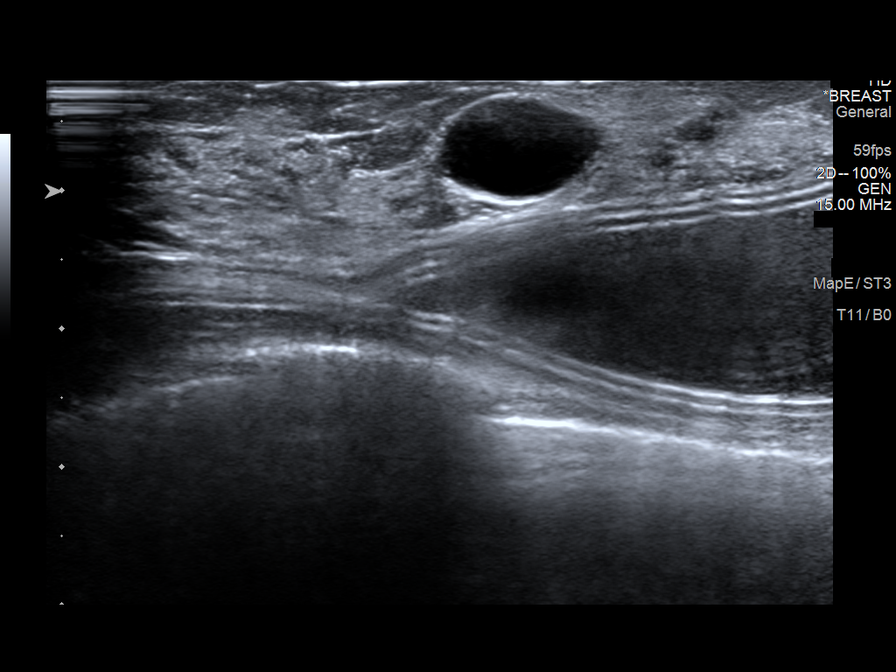
[im 7/8]
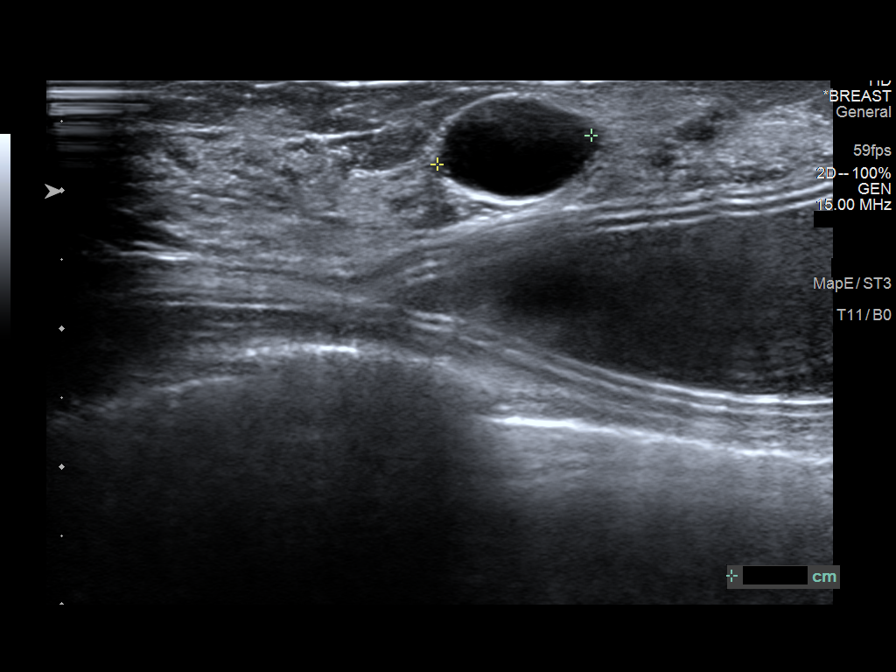
[im 8/8]
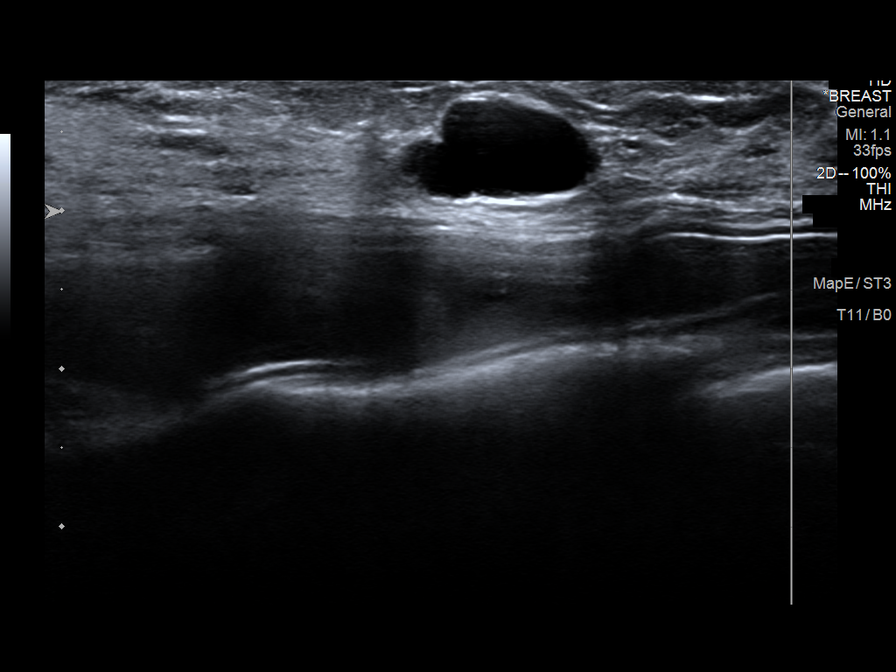

[8 of 8 positions shown; findings below may reference images not displayed]

This is a baseline mammogram.

ACR Breast Density Category c: The breast tissue is heterogeneously
dense, which may obscure small masses.
FINDINGS: Bilateral CC and MLO views, including implant displaced views, were
obtained with 3D tomosynthesis. Additional spot compression views
and exaggerated CC view, with 3D tomosynthesis, were obtained for
the left breast, corresponding to the site of the palpable lump with
overlying skin marker in place.

There is an oval circumscribed mass within the upper-outer quadrant
of the LEFT breast, corresponding to the site of the patient's
palpable lump, measuring approximately 12 mm greatest dimension,
best seen on tomosynthesis MLO slice 8.

Slightly inferior to this oval circumscribed mass within the left
breast, there is an area of architectural distortion, 2-3 o'clock
axis region, at middle depth. This distortion is also best seen on
tomosynthesis MLO slice 8 and also on spot compression tomosynthesis
MLO slice 7. The distortion is less conspicuous on the CC
projections.

There are no dominant masses, suspicious calcifications or secondary
signs of malignancy identified in the RIGHT breast.

Mammographic images were processed with CAD.

On physical exam, there is vague soft tissue thickening within the
outer left breast without evidence of circumscribed mass.

Targeted ultrasound is performed, showing a benign cyst within the
left breast at the 2 o'clock axis, 2 cm from the nipple, avascular,
measuring 1.1 x 0.6 x 1.1 cm, corresponding to the mammographic
finding and palpable abnormality.

There are no suspicious solid or cystic masses identified within the
left breast by ultrasound.

Ultrasound of the upper outer RIGHT breast was also performed for
comparison. Several benign cysts are present within the right
breast.
IMPRESSION: 1. Architectural distortion within the outer LEFT breast, 3 o'clock
axis region, at middle depth, slightly inferior to the site of a
palpable left breast cyst. This distortion is a suspicious finding
for which stereotactic-guided biopsy is recommended.
2. Benign left breast cyst at the 2 o'clock axis, 2 cm from the
nipple, corresponding to the palpable lump.
3. Additional benign cysts incidentally noted within the right
breast.

RECOMMENDATION:
Stereotactic-guided biopsy, with tomosynthesis guidance, for the
architectural distortion in the outer LEFT breast.
Stereotactic-guided biopsy is scheduled for [REDACTED] at 9 a.m.

I have discussed the findings and recommendations with the patient.
Results were also provided in writing at the conclusion of the
visit. If applicable, a reminder letter will be sent to the patient
regarding the next appointment.

BI-RADS CATEGORY  4: Suspicious.

## 2017-03-01 ENCOUNTER — Ambulatory Visit
Admission: RE | Admit: 2017-03-01 | Discharge: 2017-03-01 | Disposition: A | Discharge status: Home / Self Care | Payer: Federal, State, Local not specified - PPO | Source: Ambulatory Visit | Attending: Adult Health | Admitting: Adult Health

## 2017-03-01 ENCOUNTER — Ambulatory Visit
Admission: RE | Admit: 2017-03-01 | Discharge: 2017-03-01 | Disposition: A | Discharge status: Home / Self Care | Payer: Federal, State, Local not specified - PPO | Source: Ambulatory Visit | Attending: Hematology and Oncology | Admitting: Hematology and Oncology

## 2017-03-01 ENCOUNTER — Other Ambulatory Visit: Payer: Self-pay | Admitting: Hematology and Oncology

## 2017-03-01 DIAGNOSIS — Z170 Estrogen receptor positive status [ER+]: Principal | ICD-10-CM

## 2017-03-01 DIAGNOSIS — C50512 Malignant neoplasm of lower-outer quadrant of left female breast: Secondary | ICD-10-CM

## 2017-03-08 IMAGING — MR MR BREAST BILATERAL W WO CONTRAST
6 of 13 series · 23 of 48 positions shown · IV contrast (10ml multihance)
Comparison: Previous exam(s).

CLINICAL DATA: Recent diagnosis of left breast cancer

LABS:  Does not apply
EXAM:
BILATERAL BREAST MRI WITH AND WITHOUT CONTRAST
TECHNIQUE: Multiplanar, multisequence MR images of both breasts were obtained
prior to and following the intravenous administration of 10 ml of
MultiHance.

[Series 2: T2 · axial · 3.0mm · 0.47mm/px · z∈[-111,+51]mm · 3 of 54 slices shown]
[im 1/54]
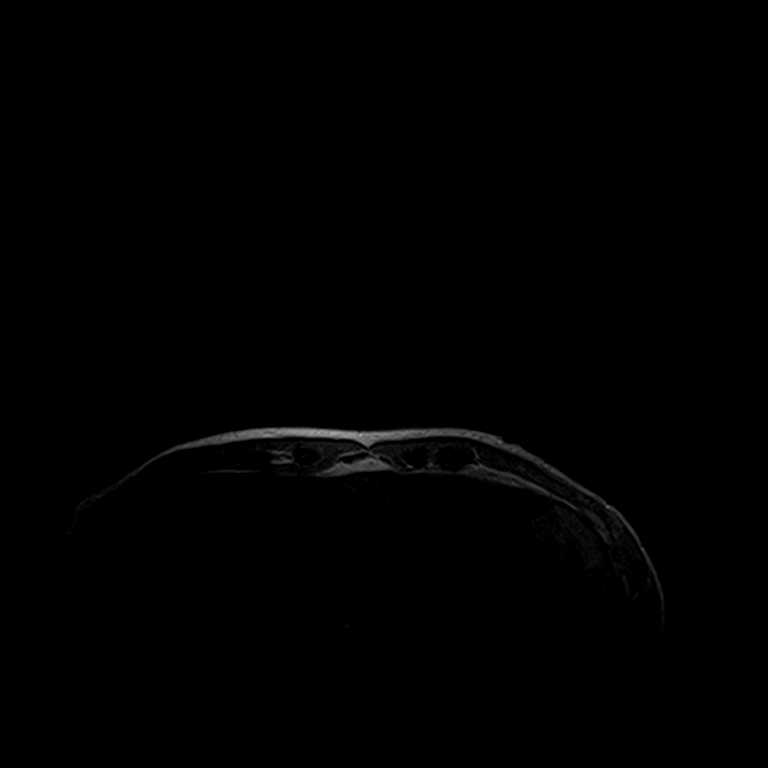
[im 27/54]
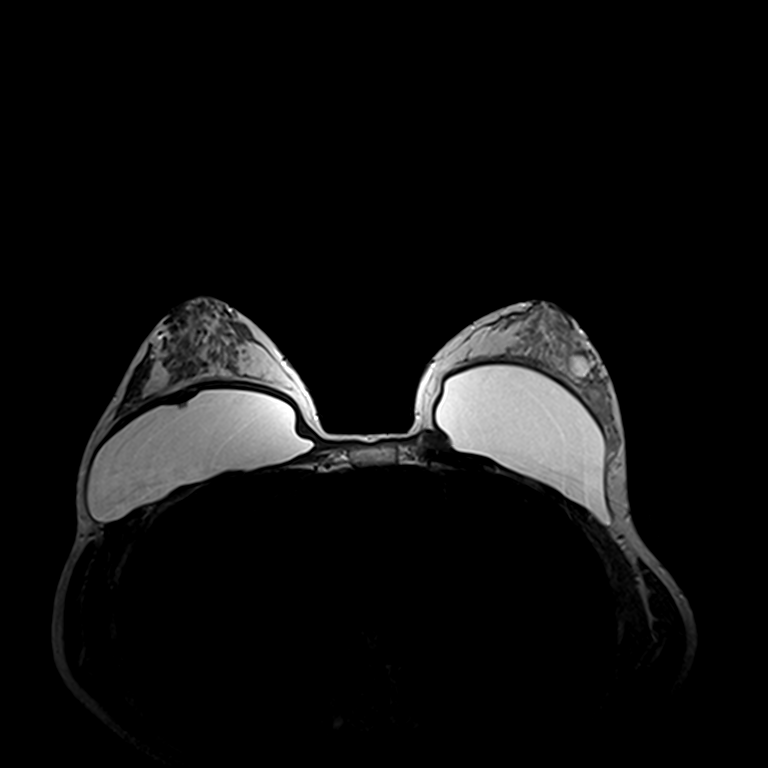
[im 54/54]
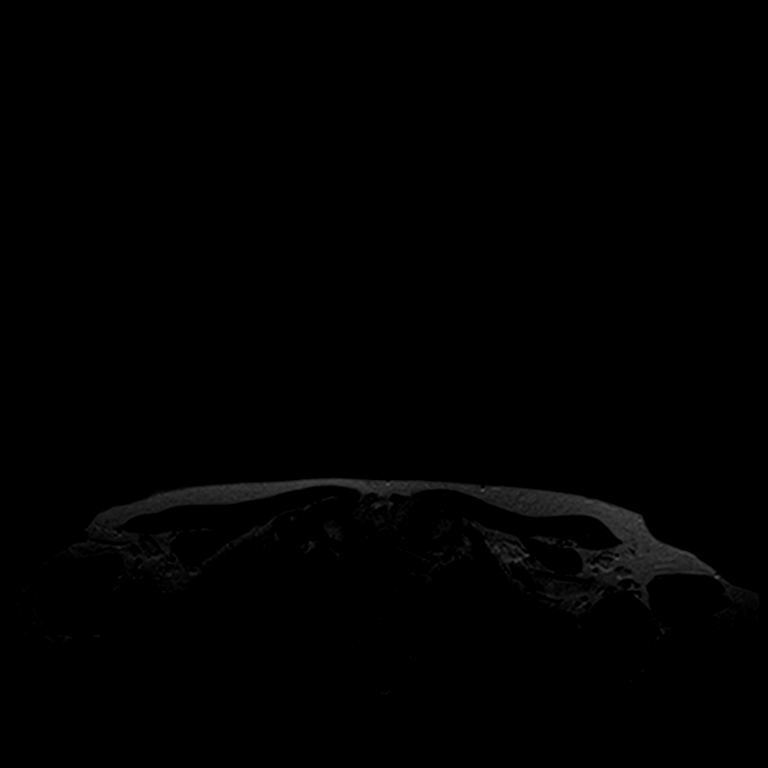

[Series 3: t2_tirm_tra ipat (a-p) · axial · 3.0mm · 0.70mm/px · z∈[-111,+51]mm · 2 of 54 slices shown]
[im 1/54]
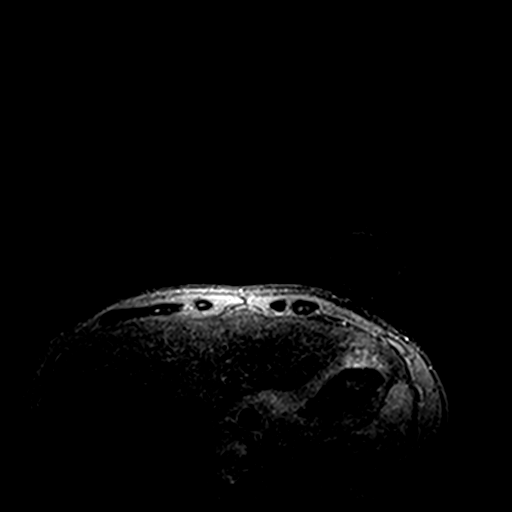
[im 54/54]
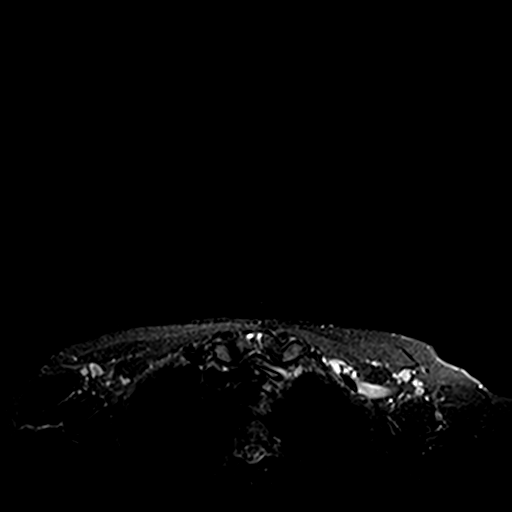

[Series 4: fl3d pre-cm no · axial · non-contrast · 1.2mm · 0.94mm/px · z∈[-116,+56]mm · 5 of 144 slices shown]
[im 1/144]
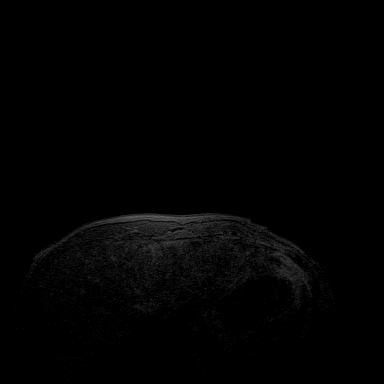
[im 36/144]
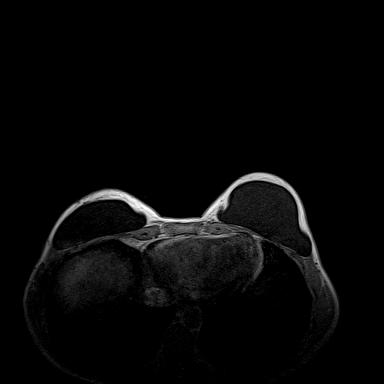
[im 72/144]
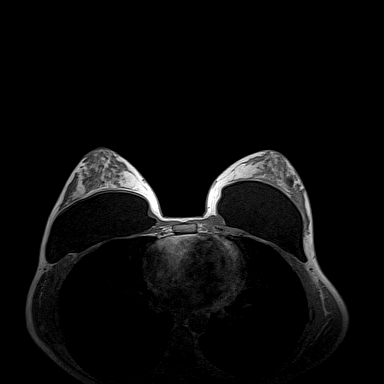
[im 108/144]
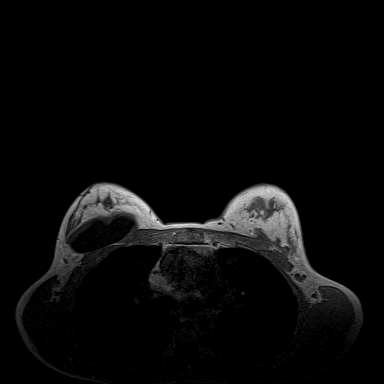
[im 144/144]
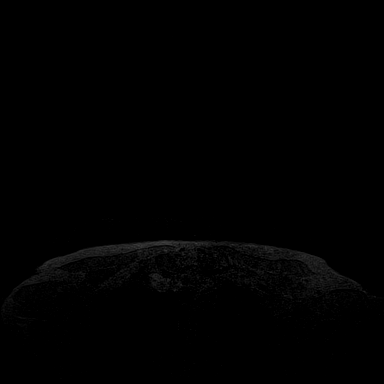

[Series 5: fl3d pre-cm · axial · non-contrast · 1.2mm · 0.94mm/px · z∈[-116,+56]mm · 5 of 144 slices shown]
[im 1/144]
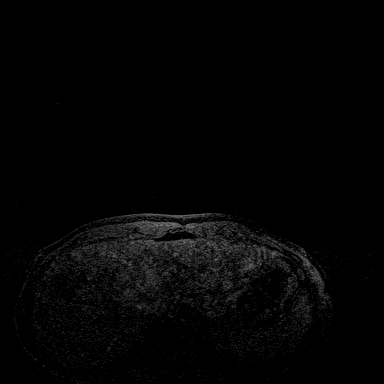
[im 36/144]
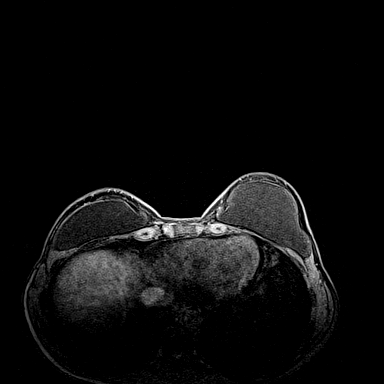
[im 72/144]
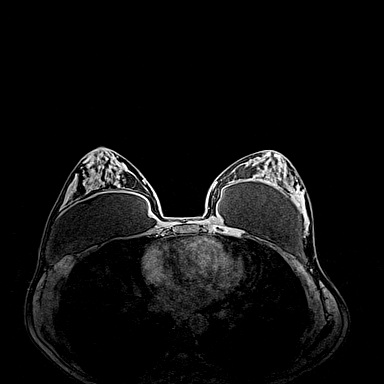
[im 108/144]
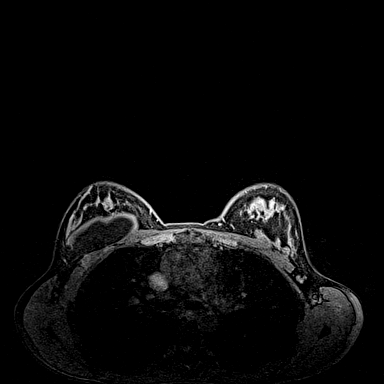
[im 144/144]
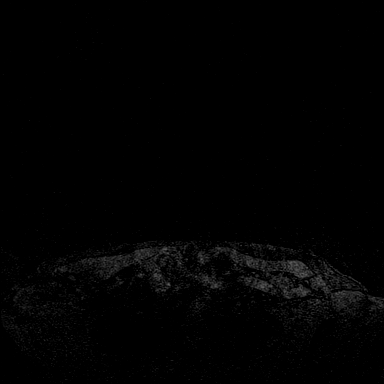

[Series 6: fl3d post immediate · axial · 1.2mm · 0.94mm/px · z∈[-116,+56]mm · 5 of 144 slices shown (1 of 2)]
[im 1/144]
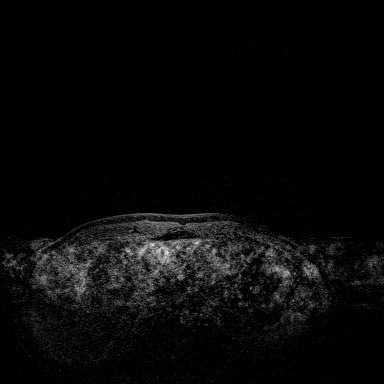
[im 36/144]
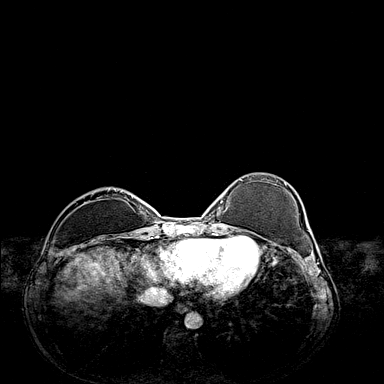
[im 72/144]
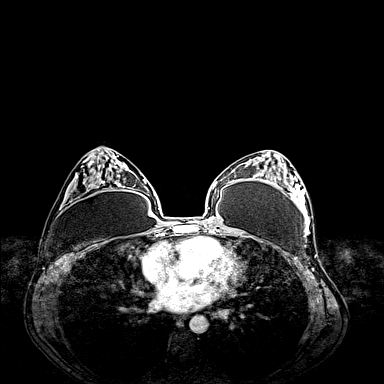
[im 108/144]
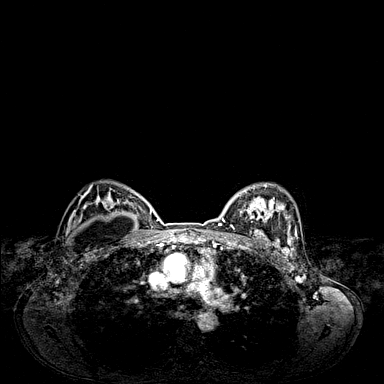
[im 144/144]
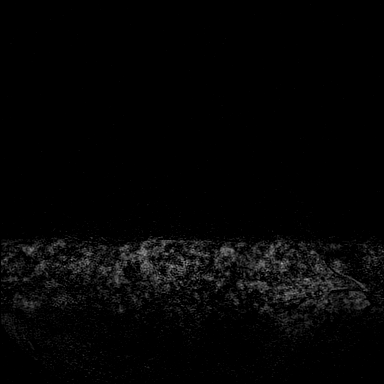

[Series 7: fl3d post immediate · axial · 1.2mm · 0.94mm/px · z∈[-116,-31]mm · 3 of 144 slices shown (2 of 2)]
[im 1/144]
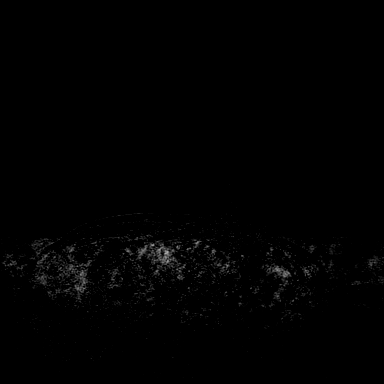
[im 36/144]
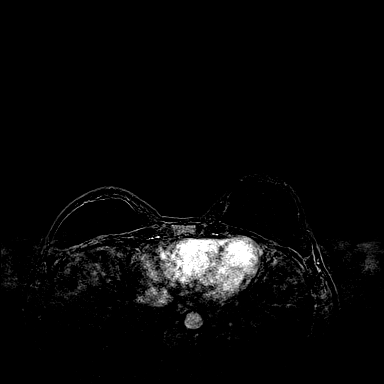
[im 72/144]
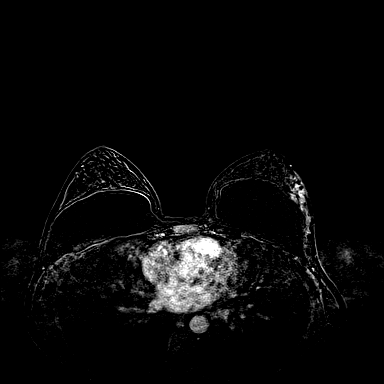

[23 of 48 positions shown; findings below may reference images not displayed]

THREE-DIMENSIONAL MR IMAGE RENDERING ON INDEPENDENT WORKSTATION:

Three-dimensional MR images were rendered by post-processing of the
original MR data on an independent workstation. The
three-dimensional MR images were interpreted, and findings are
reported in the following complete MRI report for this study. Three
dimensional images were evaluated at the independent [employer]
workstation
FINDINGS: Breast composition: c. Heterogeneous fibroglandular tissue.

Background parenchymal enhancement: Moderate.

Right breast: No mass or abnormal enhancement.

Left breast: There is diffuse non mass like enhancement with plateau
enhancement kinetics involving the left breast 3 to 5 o'clock
position measuring 1.4 x 6.3 x 3.9 cm with associated clip
consistent with patient's known cancer. The enhancement extends to
the skin laterally with mild enhancement of adjacent skin, extension
extends to the skin laterally with mild enhancement of the adjacent
skin, extension of tumor to the skin is not excluded.

Lymph nodes: No abnormal appearing lymph nodes.

Ancillary findings:  None.  Bilateral implants are noted.
IMPRESSION: Diffuse non masslike enhancement measuring 1.4 x 6.3 x 3.9 cm at the
left breast 3 to 5 o'clock position correlating to the patient's
known cancer. The enhancement extends to the skin laterally with
mild enhancement of adjacent skin, tumor extension in the skin is
not excluded.

RECOMMENDATION:
Treatment plan.

BI-RADS CATEGORY  6: Known biopsy-proven malignancy.

## 2017-05-16 ENCOUNTER — Telehealth: Payer: Self-pay | Admitting: Medical Oncology

## 2017-05-16 NOTE — Telephone Encounter (Signed)
PALLAS: 55-month follow up call Call to patient to see how she is doing. Inquired with patient if having any issues with her anti hormone medication. Patient reports to be taking tamoxifen daily and doing well and with no complaints. Patient states she is feeling "great" and denies any questions or concerns. I thanked patient for her time and continued support of study and encouraged her to call Dr. Lindi Adie or myself with any questions or concerns. Reminded patient of one year appointment on December 11th, 2019. Adele Dan, RN, BSN Clinical Research 05/16/2017 12:13 PM

## 2017-06-01 ENCOUNTER — Ambulatory Visit: Payer: Federal, State, Local not specified - PPO | Admitting: Hematology and Oncology

## 2017-12-20 ENCOUNTER — Ambulatory Visit: Payer: Federal, State, Local not specified - PPO | Admitting: Hematology and Oncology

## 2018-01-08 ENCOUNTER — Inpatient Hospital Stay
Payer: Federal, State, Local not specified - PPO | Attending: Hematology and Oncology | Admitting: Hematology and Oncology

## 2018-01-08 DIAGNOSIS — Z9013 Acquired absence of bilateral breasts and nipples: Secondary | ICD-10-CM | POA: Diagnosis not present

## 2018-01-08 DIAGNOSIS — Z79810 Long term (current) use of selective estrogen receptor modulators (SERMs): Secondary | ICD-10-CM | POA: Diagnosis not present

## 2018-01-08 DIAGNOSIS — R61 Generalized hyperhidrosis: Secondary | ICD-10-CM | POA: Insufficient documentation

## 2018-01-08 DIAGNOSIS — Z9882 Breast implant status: Secondary | ICD-10-CM | POA: Insufficient documentation

## 2018-01-08 DIAGNOSIS — Z006 Encounter for examination for normal comparison and control in clinical research program: Secondary | ICD-10-CM | POA: Diagnosis present

## 2018-01-08 DIAGNOSIS — C50512 Malignant neoplasm of lower-outer quadrant of left female breast: Secondary | ICD-10-CM | POA: Diagnosis not present

## 2018-01-08 DIAGNOSIS — Z170 Estrogen receptor positive status [ER+]: Secondary | ICD-10-CM | POA: Diagnosis not present

## 2018-01-08 NOTE — Assessment & Plan Note (Signed)
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 Pathologic staging: T2 N0 stage II a Oncotype DX score: 9, low risk, 6% risk of recurrence Genetic testing: No mutations identified, VUS PMS-2 Breast reconstruction  -------------------------------------------------------------------------------------------------- Current treatment: Tamoxifen 20 mg daily started March 2017 (premenopausal based on blood work showing FSH5.3) Clinical trial: [facility] and enrolled 08/10/2015, patient randomized to [place], discontinued 12/07/2015 due to fatigue and lack of drive  Tamoxifen toxicities: 1. Occasional night sweats due to tamoxifen 2. ankle pain which has resolved  Breast Cancer Surveillance: 1. Breast Exam: 01/08/2018: Benign 2. Mammograms 03/01/2017: Benign, breast density category C  Return to clinic in 1 year for follow-up

## 2018-01-08 NOTE — Progress Notes (Signed)
Patient Care Team: [name], [name], MD as PCP - General (Family Medicine) Truitt Merle, MD as Consulting Physician (Hematology) Jovita Kussmaul, MD as Consulting Physician (General Surgery)  DIAGNOSIS:  Encounter Diagnosis  Name Primary?  . Malignant neoplasm of lower-outer quadrant of left breast of female, estrogen receptor positive ([place])     SUMMARY OF ONCOLOGIC HISTORY: Oncology History   Breast cancer of lower-outer quadrant of left female breast ([place])   Staging form: Breast, AJCC 7th Edition     Clinical: Stage IIB (T3, N0, M0) - Unsigned     Pathologic stage from 03/09/2015: Stage IIA (T2, N0, cM0) - Signed by Truitt Merle, MD on 03/30/2015       Breast cancer of lower-outer quadrant of left female breast ([place])   01/27/2015 Mammogram    diagnostic mammogram and ultrasound showed architectural distortion within the left outer breast, 3:00 position. Area of biopsied architectural distortion measures approx 2.2 x 1.8 x 1.4 cm.     01/29/2015 Initial Biopsy    (L) breast mass biopsy showed low-grade invasive ductal carcinoma & DCIS; ER+ (95%), PR+ (80%), HER2 neg (ratio 1.22), Ki67 3%.     02/06/2015 Breast MRI    Non-masslike enhancement measuring 1.4 x 6.3 x 3.9 cm at the left breast 3 to 5:00 position. Enhancement extends to skin laterally with mild enhancement of adjacent skin, tumor extension in skin not excluded. No adenopathy. Bilat implants noted     03/09/2015 Surgery    (L) breast simple mastectomy with SLNB/immediate reconstruction ([place]): IDC, 4 cm, grade 1, extensive grade 2 DCIS with associated calcs; margins negative. 0/4 SLN. HER2 repeated, remains negative (ratio 1.12).  pT2,pN0: Stage IIA    03/09/2015 Oncotype testing    Recurrence score 9; 6% ROR (low risk)    03/25/2015 -  Anti-estrogen oral therapy    Tamoxifen 20 mg daily    05/11/2015 Miscellaneous    Genetic testing. 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 Genes evaluated: ATM, BARD1, BRCA1, BRCA2, BRIP1,  CDH1, CHEK2, EPCAM, FANCC, MLH1, MSH2, MSH6, NBN, PALB2, PMS2, PTEN, RAD51C, RAD51D, TP53, & XRCC2.     06/06/2015 Surgery    Removal of left breast tissue expander and placement of saline implant ([place])    08/17/2015 Miscellaneous    PALLAS Trial: Randomized to [place] arm.  Started Cycle #1 08/17/15; discontinued from this study due to lack of drive, fatigue and myalgias     CHIEF COMPLIANT: Follow-up in surveillance of history of left breast cancer  INTERVAL HISTORY: Joann Collins is a 52 year old with above-mentioned history left breast cancer treated with bilateral mastectomies with reconstruction.  She has been on oral antiestrogen therapy with tamoxifen.  She is tolerating tamoxifen extremely well.  She does not have any hot flashes or myalgias.  She denies any lumps or nodules in the breast.  She got a tattoo on her left breast reconstructed area.  REVIEW OF SYSTEMS:   Constitutional: Denies fevers, chills or abnormal weight loss Eyes: Denies blurriness of vision Ears, nose, mouth, throat, and face: Denies mucositis or sore throat Respiratory: Denies cough, dyspnea or wheezes Cardiovascular: Denies palpitation, chest discomfort Gastrointestinal:  Denies nausea, heartburn or change in bowel habits Skin: Denies abnormal skin rashes Lymphatics: Denies new lymphadenopathy or easy bruising Neurological:Denies numbness, tingling or new weaknesses Behavioral/Psych: Mood is stable, no new changes  Extremities: No lower extremity edema   All other systems were reviewed with the patient and are negative.  I have reviewed the past medical history,  past surgical history, social history and family history with the patient and they are unchanged from previous note.  ALLERGIES:  is allergic to penicillins.  MEDICATIONS:  Current Outpatient Medications  Medication Sig Dispense Refill  . acyclovir (ZOVIRAX) 800 MG tablet Take 800 mg by mouth daily.    . cholecalciferol 4000 units TABS  Take 1,000 Units by mouth daily.    . Cyanocobalamin 1000 MCG/15ML LIQD 5000 micrograms daily 450 mL   . Multiple Vitamin (MULTIVITAMIN WITH MINERALS) TABS tablet Take 1 tablet by mouth daily.    . tamoxifen (NOLVADEX) 20 MG tablet Take 1 tablet (20 mg total) by mouth daily. 90 tablet 3   No current facility-administered medications for this visit.     PHYSICAL EXAMINATION: ECOG PERFORMANCE STATUS: 1 - Symptomatic but completely ambulatory  Vitals:   01/08/18 1108  BP: 98/68  Pulse: 78  Resp: 18  Temp: 98.4 F (36.9 C)  SpO2: 100%   Filed Weights   01/08/18 1108  Weight: 120 lb 8 oz (54.7 kg)    GENERAL:alert, no distress and comfortable SKIN: skin color, texture, turgor are normal, no rashes or significant lesions EYES: normal, Conjunctiva are pink and non-injected, sclera clear OROPHARYNX:no exudate, no erythema and lips, buccal mucosa, and tongue normal  NECK: supple, thyroid normal size, non-tender, without nodularity LYMPH:  no palpable lymphadenopathy in the cervical, axillary or inguinal LUNGS: clear to auscultation and percussion with normal breathing effort HEART: regular rate & rhythm and no murmurs and no lower extremity edema ABDOMEN:abdomen soft, non-tender and normal bowel sounds MUSCULOSKELETAL:no cyanosis of digits and no clubbing  NEURO: alert & oriented x 3 with fluent speech, no focal motor/sensory deficits EXTREMITIES: No lower extremity edema BREAST: No palpable masses or nodules in either right or left reconstructed breasts. No palpable axillary supraclavicular or infraclavicular adenopathy n . (exam performed in the presence of a chaperone)  LABORATORY DATA:  I have reviewed the data as listed CMP Latest Ref Rng & Units 06/03/2016 12/07/2015 10/12/2015  Glucose 70 - 140 mg/dl 97 95 97  BUN 7.0 - 26.0 mg/dL 9.2 9.3 7.5  Creatinine 0.6 - 1.1 mg/dL 0.8 0.8 0.8  Sodium 136 - 145 mEq/L 139 140 144  Potassium 3.5 - 5.1 mEq/L 4.4 5.1 4.8  CO2 22 - 29 mEq/L  '24 25 25  ' Calcium 8.4 - 10.4 mg/dL 9.5 9.7 9.6  Total Protein 6.4 - 8.3 g/dL 6.8 7.1 7.5  Total Bilirubin 0.20 - 1.20 mg/dL 0.23 0.30 <0.30  Alkaline Phos 40 - 150 U/L 46 43 44  AST 5 - 34 U/L '15 18 17  ' ALT 0 - 55 U/L '9 11 13    ' Lab Results  Component Value Date   WBC 8.6 06/03/2016   HGB 12.0 06/03/2016   HCT 35.3 06/03/2016   MCV 87.2 06/03/2016   PLT 220 06/03/2016   NEUTROABS 5.4 06/03/2016    ASSESSMENT & PLAN:  Breast cancer of lower-outer quadrant of left female breast ([place]) Left breast mastectomy 03/09/2015 showed grade 1 invasive ductal carcinoma, 4 cm, 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 Pathologic staging: T2 N0 stage II a Oncotype DX score: 9, low risk, 6% risk of recurrence Genetic testing: No mutations identified, VUS PMS-2 Breast reconstruction  -------------------------------------------------------------------------------------------------- Current treatment: Tamoxifen 20 mg daily started March 2017 (premenopausal based on blood work showing FSH5.3) Clinical trial: P[facility]AS and enrolled 08/10/2015, patient randomized to [place], discontinued 12/07/2015  due to fatigue and lack of drive  Tamoxifen toxicities:  Occasional night sweats due to tamoxifen    Breast Cancer Surveillance: 1. Breast Exam: 01/08/2018: Benign 2. Mammograms no role of mammograms because she had bilateral mastectomies with reconstruction  Return to clinic in 1 year for follow-up    No orders of the defined types were placed in this encounter.  The patient has a good understanding of the overall plan. she agrees with it. she will call with any problems that may develop before the next visit here.   Harriette Ohara, MD 01/08/18

## 2018-04-03 ENCOUNTER — Other Ambulatory Visit: Payer: Self-pay | Admitting: Hematology and Oncology

## 2018-04-03 ENCOUNTER — Other Ambulatory Visit: Payer: Self-pay | Admitting: Family Medicine

## 2018-04-03 DIAGNOSIS — Z1231 Encounter for screening mammogram for malignant neoplasm of breast: Secondary | ICD-10-CM

## 2018-05-21 ENCOUNTER — Other Ambulatory Visit: Payer: Self-pay

## 2018-05-21 ENCOUNTER — Other Ambulatory Visit: Payer: Self-pay | Admitting: Hematology and Oncology

## 2018-05-21 ENCOUNTER — Ambulatory Visit
Admission: RE | Admit: 2018-05-21 | Discharge: 2018-05-21 | Disposition: A | Discharge status: Home / Self Care | Payer: Federal, State, Local not specified - PPO | Source: Ambulatory Visit | Attending: Family Medicine | Admitting: Family Medicine

## 2018-05-21 DIAGNOSIS — Z1231 Encounter for screening mammogram for malignant neoplasm of breast: Secondary | ICD-10-CM

## 2018-05-28 ENCOUNTER — Ambulatory Visit: Payer: Federal, State, Local not specified - PPO

## 2018-06-20 ENCOUNTER — Encounter: Payer: Self-pay | Admitting: Hematology and Oncology

## 2018-06-28 ENCOUNTER — Telehealth: Payer: Self-pay | Admitting: Medical Oncology

## 2018-06-28 NOTE — Telephone Encounter (Signed)
PALLAS: Joann Collins Letter LVMOM with Joann Collins to inform her that research study experts have conducted a review of the PALLAS study and have determined that there was very little chance that the addition of Palbociclib to endocrine therapy significantly reduced cancer recurrence, compared to endocrine therapy alone. Joann Collins was also informed that there was no sign of unexpected toxicity or harm to people taking Palbociclib with endocrine therapy. Joann Collins had stopped Palbociclib at an earlier time and she continues with data collection. Joann Collins was informed that a letter is being sent out via mail, this Friday the 19th, as well, informing her of the same. Joann Collins was thanked for her time and her support of study and was encouraged to call Dr. Lindi Adie or myself with any questions or concerns. Maxwell Marion, RN, BSN, [facility] Clinical Research 06/28/2018 2:47 PM

## 2018-07-12 ENCOUNTER — Other Ambulatory Visit: Payer: Self-pay | Admitting: Hematology and Oncology

## 2019-01-10 ENCOUNTER — Inpatient Hospital Stay: Payer: Federal, State, Local not specified - PPO | Admitting: Hematology and Oncology

## 2019-01-21 ENCOUNTER — Encounter: Payer: Self-pay | Admitting: Hematology and Oncology

## 2019-01-24 ENCOUNTER — Inpatient Hospital Stay
Payer: Federal, State, Local not specified - PPO | Attending: Hematology and Oncology | Admitting: Hematology and Oncology

## 2019-01-24 DIAGNOSIS — C50512 Malignant neoplasm of lower-outer quadrant of left female breast: Secondary | ICD-10-CM | POA: Insufficient documentation

## 2019-01-24 NOTE — Assessment & Plan Note (Deleted)
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 Pathologic staging: T2 N0 stage II a Oncotype DX score: 9, low risk, 6% risk of recurrence Genetic testing: No mutations identified, VUS PMS-2 Breast reconstruction  -------------------------------------------------------------------------------------------------- Current treatment: Tamoxifen 20 mg daily started March 2017 (premenopausal based on blood work showing FSH5.3) Clinical trial: PALLAS and enrolled 08/10/2015, patient randomized to [place], discontinued 12/07/2015 due to fatigue and lack of drive  Tamoxifen toxicities: Night sweats: Mild to moderate Occasional joint stiffness: Not significant enough to require any interventions.   Breast Cancer Surveillance: 1. Breast Exam: 01/24/2019: Benign 2. no role of mammograms because she had bilateral mastectomies with reconstruction  Return to clinic in 1 year for follow-up

## 2019-01-28 ENCOUNTER — Telehealth: Payer: Self-pay | Admitting: Hematology and Oncology

## 2019-01-28 NOTE — Telephone Encounter (Signed)
Returned patient's phone call regarding rescheduling 01/14 cancelled appointment, per patient's request appointment has moved to 01/29.

## 2019-02-07 ENCOUNTER — Telehealth: Payer: Self-pay | Admitting: Hematology and Oncology

## 2019-02-07 NOTE — Telephone Encounter (Signed)
Returned patient's phone call regarding rescheduling 01/29 appointments, per patient's request appointments have moved to 02/08.

## 2019-02-08 ENCOUNTER — Inpatient Hospital Stay: Payer: Federal, State, Local not specified - PPO | Admitting: Hematology and Oncology

## 2019-02-18 ENCOUNTER — Other Ambulatory Visit: Payer: Self-pay

## 2019-02-18 ENCOUNTER — Inpatient Hospital Stay
Payer: Federal, State, Local not specified - PPO | Attending: Hematology and Oncology | Admitting: Hematology and Oncology

## 2019-02-18 DIAGNOSIS — Z79899 Other long term (current) drug therapy: Secondary | ICD-10-CM | POA: Insufficient documentation

## 2019-02-18 DIAGNOSIS — Z79810 Long term (current) use of selective estrogen receptor modulators (SERMs): Secondary | ICD-10-CM | POA: Diagnosis not present

## 2019-02-18 DIAGNOSIS — Z9013 Acquired absence of bilateral breasts and nipples: Secondary | ICD-10-CM | POA: Diagnosis not present

## 2019-02-18 DIAGNOSIS — Z006 Encounter for examination for normal comparison and control in clinical research program: Secondary | ICD-10-CM | POA: Diagnosis not present

## 2019-02-18 DIAGNOSIS — C50512 Malignant neoplasm of lower-outer quadrant of left female breast: Secondary | ICD-10-CM | POA: Insufficient documentation

## 2019-02-18 DIAGNOSIS — R5383 Other fatigue: Secondary | ICD-10-CM | POA: Diagnosis not present

## 2019-02-18 DIAGNOSIS — Z170 Estrogen receptor positive status [ER+]: Secondary | ICD-10-CM

## 2019-02-18 DIAGNOSIS — Z880 Allergy status to penicillin: Secondary | ICD-10-CM | POA: Insufficient documentation

## 2019-02-18 MED ORDER — TAMOXIFEN CITRATE 20 MG PO TABS: 20.0000 mg | ORAL_TABLET | Freq: Every day | ORAL | 3 refills | Status: DC

## 2019-02-18 NOTE — Assessment & Plan Note (Signed)
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 Pathologic staging: T2 N0 stage II a Oncotype DX score: 9, low risk, 6% risk of recurrence Genetic testing: No mutations identified, VUS PMS-2 Breast reconstruction  -------------------------------------------------------------------------------------------------- Current treatment: Tamoxifen 20 mg daily started March 2017 (premenopausal based on blood work showing FSH5.3) Clinical trial: [facility] and enrolled 08/10/2015, patient randomized to [place], discontinued 12/07/2015 due to fatigue and lack of drive  Tamoxifen toxicities:  Occasional night sweats due to tamoxifen   Breast Cancer Surveillance: 1. Breast Exam:  02/18/2019: Benign 2.  Right breast mammogram 05/21/2018: No evidence of malignancy breast density category C  Return to clinic in 1 year for follow-up

## 2019-02-18 NOTE — Progress Notes (Signed)
Patient Care Team: [name], [name], MD as PCP - General (Family Medicine) Truitt Merle, MD as Consulting Physician (Hematology) Jovita Kussmaul, MD as Consulting Physician (General Surgery)  DIAGNOSIS:    ICD-10-CM   1. Malignant neoplasm of lower-outer quadrant of left breast of female, estrogen receptor positive ([place])  C50.512    Z17.0     SUMMARY OF ONCOLOGIC HISTORY: Oncology History Overview Note  Breast cancer of lower-outer quadrant of left female breast ([place])   Staging form: Breast, AJCC 7th Edition     Clinical: Stage IIB (T3, N0, M0) - Unsigned     Pathologic stage from 03/09/2015: Stage IIA (T2, N0, cM0) - Signed by Truitt Merle, MD on 03/30/2015     Breast cancer of lower-outer quadrant of left female breast ([place])  01/27/2015 Mammogram   diagnostic mammogram and ultrasound showed architectural distortion within the left outer breast, 3:00 position. Area of biopsied architectural distortion measures approx 2.2 x 1.8 x 1.4 cm.    01/29/2015 Initial Biopsy   (L) breast mass biopsy showed low-grade invasive ductal carcinoma & DCIS; ER+ (95%), PR+ (80%), HER2 neg (ratio 1.22), Ki67 3%.    02/06/2015 Breast MRI   Non-masslike enhancement measuring 1.4 x 6.3 x 3.9 cm at the left breast 3 to 5:00 position. Enhancement extends to skin laterally with mild enhancement of adjacent skin, tumor extension in skin not excluded. No adenopathy. Bilat implants noted    03/09/2015 Surgery   (L) breast simple mastectomy with SLNB/immediate reconstruction (Toth/Thimmappa): IDC, 4 cm, grade 1, extensive grade 2 DCIS with associated calcs; margins negative. 0/4 SLN. HER2 repeated, remains negative (ratio 1.12).  pT2,pN0: Stage IIA   03/09/2015 Oncotype testing   Recurrence score 9; 6% ROR (low risk)   03/25/2015 -  Anti-estrogen oral therapy   Tamoxifen 20 mg daily   05/11/2015 Miscellaneous   Genetic testing. 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 Genes evaluated: ATM, BARD1, BRCA1, BRCA2, BRIP1, CDH1,  CHEK2, EPCAM, FANCC, MLH1, MSH2, MSH6, NBN, PALB2, PMS2, PTEN, RAD51C, RAD51D, TP53, & XRCC2.    06/06/2015 Surgery   Removal of left breast tissue expander and placement of saline implant (Thimmappa)   08/17/2015 Miscellaneous   PALLAS Trial: Randomized to Ibrance arm.  Started Cycle #1 08/17/15; discontinued from this study due to lack of drive, fatigue and myalgias     CHIEF COMPLIANT: Follow-up of left breast cancer on tamoxifen  INTERVAL HISTORY: Joann Collins is a 54 y.o. with above-mentioned history of left breast cancer treated with bilateral mastectomies with reconstruction and who is currently on antiestrogen therapy with tamoxifen. Mammogram on 05/21/18 showed no evidence of malignancy bilaterally. She presents to the clinic today for annual follow-up.   ALLERGIES:  is allergic to penicillins.  MEDICATIONS:  Current Outpatient Medications  Medication Sig Dispense Refill  . acyclovir (ZOVIRAX) 800 MG tablet Take 800 mg by mouth daily.    . cholecalciferol 4000 units TABS Take 1,000 Units by mouth daily.    . Cyanocobalamin 1000 MCG/15ML LIQD 5000 micrograms daily 450 mL   . Multiple Vitamin (MULTIVITAMIN WITH MINERALS) TABS tablet Take 1 tablet by mouth daily.    . tamoxifen (NOLVADEX) 20 MG tablet TAKE 1 TABLET BY MOUTH DAILY 90 tablet 3   No current facility-administered medications for this visit.    PHYSICAL EXAMINATION: ECOG PERFORMANCE STATUS: 1 - Symptomatic but completely ambulatory  Vitals:   02/18/19 1051  BP: 113/79  Pulse: 78  Resp: 20  Temp: 98 F (36.7 C)  SpO2: 100%   Filed Weights   02/18/19 1051  Weight: 126 lb 1.6 oz (57.2 kg)    BREAST: No palpable masses or nodules in either right or left reconstructed breasts. No palpable axillary supraclavicular or infraclavicular adenopathy no breast tenderness or nipple discharge. (exam performed in the presence of a chaperone)  LABORATORY DATA:  I have reviewed the data as listed CMP Latest Ref Rng &  Units 06/03/2016 12/07/2015 10/12/2015  Glucose 70 - 140 mg/dl 97 95 97  BUN 7.0 - 26.0 mg/dL 9.2 9.3 7.5  Creatinine 0.6 - 1.1 mg/dL 0.8 0.8 0.8  Sodium 136 - 145 mEq/L 139 140 144  Potassium 3.5 - 5.1 mEq/L 4.4 5.1 4.8  CO2 22 - 29 mEq/L '24 25 25  ' Calcium 8.4 - 10.4 mg/dL 9.5 9.7 9.6  Total Protein 6.4 - 8.3 g/dL 6.8 7.1 7.5  Total Bilirubin 0.20 - 1.20 mg/dL 0.23 0.30 <0.30  Alkaline Phos 40 - 150 U/L 46 43 44  AST 5 - 34 U/L '15 18 17  ' ALT 0 - 55 U/L '9 11 13    ' Lab Results  Component Value Date   WBC 8.6 06/03/2016   HGB 12.0 06/03/2016   HCT 35.3 06/03/2016   MCV 87.2 06/03/2016   PLT 220 06/03/2016   NEUTROABS 5.4 06/03/2016    ASSESSMENT & PLAN:  Breast cancer of lower-outer quadrant of left female breast ([place]) Left breast mastectomy 03/09/2015 showed grade 1 invasive ductal carcinoma, 4 cm, 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 Pathologic staging: T2 N0 stage II a Oncotype DX score: 9, low risk, 6% risk of recurrence Genetic testing: No mutations identified, VUS PMS-2 Breast reconstruction  -------------------------------------------------------------------------------------------------- Current treatment: Tamoxifen 20 mg daily started March 2017 (premenopausal based on blood work showing FSH5.3) Clinical trial: PALLAS and enrolled 08/10/2015, patient randomized to [place], discontinued 12/07/2015 due to fatigue and lack of drive  Tamoxifen toxicities: Denies any hot flashes or night sweats.   Breast Cancer Surveillance: 1. Breast Exam:  02/18/2019: Benign 2.  Right breast mammogram 05/21/2018: No evidence of malignancy breast density category C  She worries about her weight even though she looks fantastic for her height and weight. I encouraged her to continue to exercise and stay fit.  Return to clinic in 1 year for follow-up    No orders of the defined  types were placed in this encounter.  The patient has a good understanding of the overall plan. she agrees with it. she will call with any problems that may develop before the next visit here.  Total time spent: 20 mins including face to face time and time spent for planning, charting and coordination of care  Nicholas Lose, MD 02/18/2019  I, Cloyde Reams Dorshimer, am acting as scribe for Dr. Nicholas Lose.  I have reviewed the above documentation for accuracy and completeness, and I agree with the above.

## 2019-02-19 ENCOUNTER — Telehealth: Payer: Self-pay | Admitting: Hematology and Oncology

## 2019-02-19 NOTE — Telephone Encounter (Signed)
I talk with patient regarding schedule  

## 2019-04-26 ENCOUNTER — Other Ambulatory Visit: Payer: Self-pay | Admitting: Hematology and Oncology

## 2019-04-26 DIAGNOSIS — Z1231 Encounter for screening mammogram for malignant neoplasm of breast: Secondary | ICD-10-CM

## 2019-04-28 ENCOUNTER — Other Ambulatory Visit: Payer: Self-pay | Admitting: Hematology and Oncology

## 2019-05-12 LAB — COLOGUARD

## 2019-05-25 LAB — COLOGUARD: COLOGUARD: NEGATIVE

## 2019-06-11 ENCOUNTER — Other Ambulatory Visit: Payer: Self-pay

## 2019-06-11 ENCOUNTER — Ambulatory Visit
Admission: RE | Admit: 2019-06-11 | Discharge: 2019-06-11 | Disposition: A | Discharge status: Home / Self Care | Payer: Federal, State, Local not specified - PPO | Source: Ambulatory Visit | Attending: Hematology and Oncology | Admitting: Hematology and Oncology

## 2019-06-11 DIAGNOSIS — Z1231 Encounter for screening mammogram for malignant neoplasm of breast: Secondary | ICD-10-CM

## 2019-06-21 ENCOUNTER — Other Ambulatory Visit: Payer: Self-pay | Admitting: *Deleted

## 2019-06-21 MED ORDER — TAMOXIFEN CITRATE 20 MG PO TABS: 20.0000 mg | ORAL_TABLET | Freq: Every day | ORAL | 0 refills | Status: DC

## 2019-09-23 ENCOUNTER — Other Ambulatory Visit: Payer: Self-pay | Admitting: Hematology and Oncology

## 2019-12-12 ENCOUNTER — Telehealth: Payer: Self-pay | Admitting: Hematology and Oncology

## 2019-12-12 NOTE — Telephone Encounter (Signed)
Called pt per 12/1 sch msg - no answer. Left message for patient to call back and reschedule if still needed

## 2020-02-17 NOTE — Progress Notes (Incomplete)
Patient Care Team: [name], [name], MD as PCP - General (Family Medicine) Truitt Merle, MD as Consulting Physician (Hematology) Jovita Kussmaul, MD as Consulting Physician (General Surgery)  DIAGNOSIS: No diagnosis found.  SUMMARY OF ONCOLOGIC HISTORY: Oncology History Overview Note  Breast cancer of lower-outer quadrant of left female breast ([place])   Staging form: Breast, AJCC 7th Edition     Clinical: Stage IIB (T3, N0, M0) - Unsigned     Pathologic stage from 03/09/2015: Stage IIA (T2, N0, cM0) - Signed by Truitt Merle, MD on 03/30/2015     Breast cancer of lower-outer quadrant of left female breast ([place])  01/27/2015 Mammogram   diagnostic mammogram and ultrasound showed architectural distortion within the left outer breast, 3:00 position. Area of biopsied architectural distortion measures approx 2.2 x 1.8 x 1.4 cm.    01/29/2015 Initial Biopsy   (L) breast mass biopsy showed low-grade invasive ductal carcinoma & DCIS; ER+ (95%), PR+ (80%), HER2 neg (ratio 1.22), Ki67 3%.    02/06/2015 Breast MRI   Non-masslike enhancement measuring 1.4 x 6.3 x 3.9 cm at the left breast 3 to 5:00 position. Enhancement extends to skin laterally with mild enhancement of adjacent skin, tumor extension in skin not excluded. No adenopathy. Bilat implants noted    03/09/2015 Surgery   (L) breast simple mastectomy with SLNB/immediate reconstruction (Toth/Thimmappa): IDC, 4 cm, grade 1, extensive grade 2 DCIS with associated calcs; margins negative. 0/4 SLN. HER2 repeated, remains negative (ratio 1.12).  pT2,pN0: Stage IIA   03/09/2015 Oncotype testing   Recurrence score 9; 6% ROR (low risk)   03/25/2015 -  Anti-estrogen oral therapy   Tamoxifen 20 mg daily   05/11/2015 Miscellaneous   Genetic testing. 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 Genes evaluated: ATM, BARD1, BRCA1, BRCA2, BRIP1, CDH1, CHEK2, EPCAM, FANCC, MLH1, MSH2, MSH6, NBN, PALB2, PMS2, PTEN, RAD51C, RAD51D, TP53, & XRCC2.    06/06/2015 Surgery    Removal of left breast tissue expander and placement of saline implant (Thimmappa)   08/17/2015 Miscellaneous   PALLAS Trial: Randomized to Ibrance arm.  Started Cycle #1 08/17/15; discontinued from this study due to lack of drive, fatigue and myalgias     CHIEF COMPLIANT: Follow-up of left breast cancer on tamoxifen  INTERVAL HISTORY: Joann Collins is a 55 y.o. with above-mentioned history of left breast cancer treated with bilateral mastectomies with reconstruction and who is currently on antiestrogen therapy with tamoxifen. Mammogram on 06/11/19 showed no evidence of malignancy in the right breast. She presents to the clinic today for annual follow-up.   ALLERGIES:  is allergic to penicillins.  MEDICATIONS:  Current Outpatient Medications  Medication Sig Dispense Refill  . acyclovir (ZOVIRAX) 800 MG tablet Take 800 mg by mouth daily.    . cholecalciferol 4000 units TABS Take 1,000 Units by mouth daily.    . Cyanocobalamin 1000 MCG/15ML LIQD 5000 micrograms daily 450 mL   . Multiple Vitamin (MULTIVITAMIN WITH MINERALS) TABS tablet Take 1 tablet by mouth daily.    . tamoxifen (NOLVADEX) 20 MG tablet TAKE 1 TABLET(20 MG) BY MOUTH DAILY 90 tablet 0   No current facility-administered medications for this visit.    PHYSICAL EXAMINATION: ECOG PERFORMANCE STATUS: {CHL ONC ECOG PS:[id number]}  There were no vitals filed for this visit. There were no vitals filed for this visit.  BREAST:*** No palpable masses or nodules in either right or left breasts. No palpable axillary supraclavicular or infraclavicular adenopathy no breast tenderness or nipple discharge. (exam performed in the  presence of a chaperone)  LABORATORY DATA:  I have reviewed the data as listed CMP Latest Ref Rng & Units 06/03/2016 12/07/2015 10/12/2015  Glucose 70 - 140 mg/dl 97 95 97  BUN 7.0 - 26.0 mg/dL 9.2 9.3 7.5  Creatinine 0.6 - 1.1 mg/dL 0.8 0.8 0.8  Sodium 136 - 145 mEq/L 139 140 144  Potassium 3.5 - 5.1 mEq/L  4.4 5.1 4.8  CO2 22 - 29 mEq/L '24 25 25  ' Calcium 8.4 - 10.4 mg/dL 9.5 9.7 9.6  Total Protein 6.4 - 8.3 g/dL 6.8 7.1 7.5  Total Bilirubin 0.20 - 1.20 mg/dL 0.23 0.30 <0.30  Alkaline Phos 40 - 150 U/L 46 43 44  AST 5 - 34 U/L '15 18 17  ' ALT 0 - 55 U/L '9 11 13    ' Lab Results  Component Value Date   WBC 8.6 06/03/2016   HGB 12.0 06/03/2016   HCT 35.3 06/03/2016   MCV 87.2 06/03/2016   PLT 220 06/03/2016   NEUTROABS 5.4 06/03/2016    ASSESSMENT & PLAN:  No problem-specific Assessment & Plan notes found for this encounter.    No orders of the defined types were placed in this encounter.  The patient has a good understanding of the overall plan. she agrees with it. she will call with any problems that may develop before the next visit here.  Total time spent: *** mins including face to face time and time spent for planning, charting and coordination of care  Rulon Eisenmenger, MD, MPH 02/17/2020  I, Cloyde Reams Dorshimer, am acting as scribe for Dr. Nicholas Lose.  {insert scribe attestation}

## 2020-02-18 ENCOUNTER — Inpatient Hospital Stay: Payer: Federal, State, Local not specified - PPO | Admitting: Hematology and Oncology

## 2020-02-18 ENCOUNTER — Telehealth: Payer: Self-pay | Admitting: Hematology and Oncology

## 2020-02-18 NOTE — Telephone Encounter (Signed)
R/s appointment per secure chat with RN Merleen Nicely. Called patient, no answer. Left message with updated appointment date and time.

## 2020-02-18 NOTE — Progress Notes (Signed)
HEMATOLOGY-ONCOLOGY MYCHART VIDEO VISIT PROGRESS NOTE  I connected with Joann Collins on 02/19/2020 at  8:30 AM EST by MyChart video conference and verified that I am speaking with the correct person using two identifiers.  I discussed the limitations, risks, security and privacy concerns of performing an evaluation and management service by MyChart and the availability of in person appointments.  I also discussed with the patient that there may be a patient responsible charge related to this service. The patient expressed understanding and agreed to proceed.  Patient's Location: Home Physician Location: Home  CHIEF COMPLIANT: Follow-up of left breast cancer on tamoxifen  INTERVAL HISTORY: Joann Collins is a 55 y.o. female with above-mentioned history of left breast cancer treated with bilateral mastectomies with reconstruction and who is currently on antiestrogen therapy with tamoxifen. Mammogram on 06/11/19 showed no evidence of malignancy bilaterally. She presents to the clinic today for annual follow-up. Tol Tamoxifen very well. Lost weight and is now down to 107 lbs.  Oncology History Overview Note  Breast cancer of lower-outer quadrant of left female breast [facility])   Staging form: Breast, AJCC 7th Edition     Clinical: Stage IIB (T3, N0, M0) - Unsigned     Pathologic stage from 03/09/2015: Stage IIA (T2, N0, cM0) - Signed by Truitt Merle, MD on 03/30/2015     Breast cancer of lower-outer quadrant of left female breast ([place])  01/27/2015 Mammogram   diagnostic mammogram and ultrasound showed architectural distortion within the left outer breast, 3:00 position. Area of biopsied architectural distortion measures approx 2.2 x 1.8 x 1.4 cm.    01/29/2015 Initial Biopsy   (L) breast mass biopsy showed low-grade invasive ductal carcinoma & DCIS; ER+ (95%), PR+ (80%), HER2 neg (ratio 1.22), Ki67 3%.    02/06/2015 Breast MRI   Non-masslike enhancement measuring 1.4 x 6.3 x 3.9 cm at the left  breast 3 to 5:00 position. Enhancement extends to skin laterally with mild enhancement of adjacent skin, tumor extension in skin not excluded. No adenopathy. Bilat implants noted    03/09/2015 Surgery   (L) breast simple mastectomy with SLNB/immediate reconstruction (Joann Collins/Joann Collins): IDC, 4 cm, grade 1, extensive grade 2 DCIS with associated calcs; margins negative. 0/4 SLN. HER2 repeated, remains negative (ratio 1.12).  pT2,pN0: Stage IIA   03/09/2015 Oncotype testing   Recurrence score 9; 6% ROR (low risk)   03/25/2015 -  Anti-estrogen oral therapy   Tamoxifen 20 mg daily   05/11/2015 Miscellaneous   Genetic testing. 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 Genes evaluated: ATM, BARD1, BRCA1, BRCA2, BRIP1, CDH1, CHEK2, EPCAM, FANCC, MLH1, MSH2, MSH6, NBN, PALB2, PMS2, PTEN, RAD51C, RAD51D, TP53, & XRCC2.    06/06/2015 Surgery   Removal of left breast tissue expander and placement of saline implant (Joann Collins)   08/17/2015 Miscellaneous   PALLAS Trial: Randomized to Ibrance arm.  Started Cycle #1 08/17/15; discontinued from this study due to lack of drive, fatigue and myalgias     Observations/Objective:  There were no vitals filed for this visit. There is no height or weight on file to calculate BMI.  I have reviewed the data as listed CMP Latest Ref Rng & Units 06/03/2016 12/07/2015 10/12/2015  Glucose 70 - 140 mg/dl 97 95 97  BUN 7.0 - 26.0 mg/dL 9.2 9.3 7.5  Creatinine 0.6 - 1.1 mg/dL 0.8 0.8 0.8  Sodium 136 - 145 mEq/L 139 140 144  Potassium 3.5 - 5.1 mEq/L 4.4 5.1 4.8  CO2 22 - 29 mEq/L 24 25 25  Calcium 8.4 - 10.4 mg/dL 9.5 9.7 9.6  Total Protein 6.4 - 8.3 g/dL 6.8 7.1 7.5  Total Bilirubin 0.20 - 1.20 mg/dL 0.23 0.30 <0.30  Alkaline Phos 40 - 150 U/L 46 43 44  AST 5 - 34 U/L '15 18 17  ' ALT 0 - 55 U/L '9 11 13    ' Lab Results  Component Value Date   WBC 8.6 06/03/2016   HGB 12.0 06/03/2016   HCT 35.3 06/03/2016   MCV 87.2 06/03/2016   PLT 220 06/03/2016   NEUTROABS 5.4 06/03/2016       Assessment Plan:  Breast cancer of lower-outer quadrant of left female breast ([place]) Left breast mastectomy 03/09/2015 showed grade 1 invasive ductal carcinoma, 4 cm, 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 Pathologic staging: T2 N0 stage II a Oncotype DX score: 9, low risk, 6% risk of recurrence Genetic testing: No mutations identified, VUS PMS-2 Breast reconstruction  -------------------------------------------------------------------------------------------------- Current treatment: Tamoxifen 20 mg daily started March 2017 (premenopausal based on blood work showing FSH5.3) Clinical trial: PALLAS and enrolled 08/10/2015, patient randomized to [place], discontinued 12/07/2015 due to fatigue and lack of drive  Tamoxifen toxicities: Denies any hot flashes or night sweats.  Breast Cancer Surveillance: 1. Breast Exam: 02/18/2019: Benign 2.  Right breast mammogram 06/11/2019: No evidence of malignancy breast density category C  Weight: Dropped to 107 lbs  Return to clinic in 1 year for follow-up    I discussed the assessment and treatment plan with the patient. The patient was provided an opportunity to ask questions and all were answered. The patient agreed with the plan and demonstrated an understanding of the instructions. The patient was advised to call back or seek an in-person evaluation if the symptoms worsen or if the condition fails to improve as anticipated.   Total time spent: 20 minutes including face-to-face MyChart video visit time and time spent for planning, charting and coordination of care  Rulon Eisenmenger, MD 02/19/2020   I, Cloyde Reams Dorshimer, am acting as scribe for Joann Lose, MD.  I have reviewed the above documentation for accuracy and completeness, and I agree with the above.

## 2020-02-19 ENCOUNTER — Inpatient Hospital Stay
Payer: Federal, State, Local not specified - PPO | Attending: Hematology and Oncology | Admitting: Hematology and Oncology

## 2020-02-19 ENCOUNTER — Telehealth: Payer: Self-pay | Admitting: Medical Oncology

## 2020-02-19 DIAGNOSIS — Z79810 Long term (current) use of selective estrogen receptor modulators (SERMs): Secondary | ICD-10-CM | POA: Insufficient documentation

## 2020-02-19 DIAGNOSIS — C50512 Malignant neoplasm of lower-outer quadrant of left female breast: Secondary | ICD-10-CM | POA: Diagnosis not present

## 2020-02-19 DIAGNOSIS — Z9013 Acquired absence of bilateral breasts and nipples: Secondary | ICD-10-CM | POA: Insufficient documentation

## 2020-02-19 DIAGNOSIS — Z79899 Other long term (current) drug therapy: Secondary | ICD-10-CM | POA: Diagnosis not present

## 2020-02-19 DIAGNOSIS — R5383 Other fatigue: Secondary | ICD-10-CM | POA: Insufficient documentation

## 2020-02-19 DIAGNOSIS — Z170 Estrogen receptor positive status [ER+]: Secondary | ICD-10-CM

## 2020-02-19 MED ORDER — TAMOXIFEN CITRATE 20 MG PO TABS: ORAL_TABLET | ORAL | 0 refills | Status: DC

## 2020-02-19 NOTE — Assessment & Plan Note (Signed)
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 Pathologic staging: T2 N0 stage II a Oncotype DX score: 9, low risk, 6% risk of recurrence Genetic testing: No mutations identified, VUS PMS-2 Breast reconstruction  -------------------------------------------------------------------------------------------------- Current treatment: Tamoxifen 20 mg daily started March 2017 (premenopausal based on blood work showing FSH5.3) Clinical trial: PALLAS and enrolled 08/10/2015, patient randomized to [place], discontinued 12/07/2015 due to fatigue and lack of drive  Tamoxifen toxicities: Denies any hot flashes or night sweats.  Breast Cancer Surveillance: 1. Breast Exam: 02/18/2019: Benign 2.  Right breast mammogram 06/11/2019: No evidence of malignancy breast density category C  She worries about her weight even though she looks fantastic for her height and weight. I encouraged her to continue to exercise and stay fit.  Return to clinic in 1 year for follow-up

## 2020-02-19 NOTE — Telephone Encounter (Signed)
PALLAS: PALbociclib CoLlaborative Adjuvant Study:  A randomized phase III trial of Palbociclib with standard adjuvant endocrine therapy versus standard adjuvant endocrine therapy alone for hormone receptor positive (HR+) / human epidermal growth factor receptor 2 (HER2)-negative early breast cancer   Outgoing call: Call to patient to discuss new consent for study. Patient was scheduled for clinic visit yesterday, appointment was changed to a video visit with MD. I was hoping to discuss updated study consent form AFT ICF Version 8, dated 02/27/2019 with patient when in clinic yesterday. But, due to the change in the type of MD appointment, I was unable to. I spoke with patient on the phone briefly this afternoon, she states she is unable to talk due to on a work conference call and asked if I could call her back tomorrow after 3PM. Patient informed I will call her tomorrow at her requested time.  Patient thanked.

## 2020-02-20 ENCOUNTER — Telehealth: Payer: Self-pay | Admitting: Medical Oncology

## 2020-02-20 NOTE — Telephone Encounter (Signed)
PALLAS: PALbociclib CoLlaborative Adjuvant Study:  A randomized phase III trial of Palbociclib with standard adjuvant endocrine therapy versus standard adjuvant endocrine therapy alone for hormone receptor positive (HR+) / human epidermal growth factor receptor 2 (HER2)-negative early breast cancer   Outgoing call: Follow up call to patient regarding yesterday's initial conversation about the study's new consent form. I informed patient of the study updated consent form and even though she is being followed for data collection only, the study requires that she be re-consented and would need to come to clinic to sign documents. Patient gave her verbal understanding and states that her work schedule is very busy and is unsure when she will be available to come to the clinic. I provided the patient the option of withdrawal from the study, due to her work schedule and availability. Patient felt this would be the best option as well. I informed her that I will send a Withdrawal of Consent form to her in the mail, for her to sign, with a postage paid envelope for return. Patient thanked for her time and her contribution to the study and was encouraged to call Dr. Lindi Adie or myself with any questions or concerns.  Maxwell Marion, RN, BSN, [facility] Clinical Research 02/20/2020 4:33 PM

## 2020-04-06 ENCOUNTER — Encounter: Payer: Self-pay | Admitting: Medical Oncology

## 2020-04-06 NOTE — Progress Notes (Signed)
PALLAS: PALbociclib CoLlaborative Adjuvant Study:  A randomized phase III trial of Palbociclib with standard adjuvant endocrine therapy versus standard adjuvant endocrine therapy alone for hormone receptor positive (HR+) / human epidermal growth factor receptor 2 (HER2)-negative early breast cancer   Received in mail today, patient's signed Withdrawal of Consent form, withdrawing her consent to participate in this study and refuses to be followed and have clinical data collected from her medical records. Patient's date of signature on form is April 01, 2020.  Patient will be withdrawn from study. Maxwell Marion, RN, BSN, [facility] Clinical Research 04/06/2020 9:48 AM

## 2020-04-27 ENCOUNTER — Other Ambulatory Visit: Payer: Self-pay | Admitting: Hematology and Oncology

## 2020-04-27 DIAGNOSIS — Z1231 Encounter for screening mammogram for malignant neoplasm of breast: Secondary | ICD-10-CM

## 2020-05-17 ENCOUNTER — Other Ambulatory Visit: Payer: Self-pay | Admitting: Hematology and Oncology

## 2020-05-18 ENCOUNTER — Other Ambulatory Visit: Payer: Self-pay | Admitting: Hematology and Oncology

## 2020-06-15 ENCOUNTER — Ambulatory Visit: Payer: Federal, State, Local not specified - PPO

## 2020-06-20 ENCOUNTER — Ambulatory Visit: Payer: Federal, State, Local not specified - PPO

## 2020-06-27 ENCOUNTER — Ambulatory Visit
Admission: RE | Admit: 2020-06-27 | Discharge: 2020-06-27 | Disposition: A | Discharge status: Home / Self Care | Payer: Federal, State, Local not specified - PPO | Source: Ambulatory Visit | Attending: Hematology and Oncology | Admitting: Hematology and Oncology

## 2020-06-27 DIAGNOSIS — Z1231 Encounter for screening mammogram for malignant neoplasm of breast: Secondary | ICD-10-CM

## 2021-02-08 ENCOUNTER — Other Ambulatory Visit: Payer: Self-pay | Admitting: *Deleted

## 2021-02-08 ENCOUNTER — Encounter: Payer: Self-pay | Admitting: Hematology and Oncology

## 2021-02-08 MED ORDER — TAMOXIFEN CITRATE 20 MG PO TABS: ORAL_TABLET | ORAL | 3 refills | Status: DC

## 2021-02-24 ENCOUNTER — Telehealth: Payer: Self-pay | Admitting: *Deleted

## 2021-02-24 NOTE — Telephone Encounter (Signed)
Received call from pt stating since overcoming Covid 19 two weeks ago, she has been experiencing swollen/painful lymph node on the left side of her neck.  Pt states she f/u with PCP and was prescribed zpak with no relief.  Pt very anxious given hx of left sided breast cancer and requesting to be seen in the office for evaluation.  Appt schedule and pt verbalized understanding of date and time.

## 2021-02-26 ENCOUNTER — Other Ambulatory Visit: Payer: Self-pay

## 2021-02-26 ENCOUNTER — Inpatient Hospital Stay: Payer: 59 | Attending: Adult Health | Admitting: Adult Health

## 2021-02-26 VITALS — BP 109/73 | HR 103 | Temp 97.7°F | Resp 16 | Ht 61.0 in | Wt 114.3 lb

## 2021-02-26 DIAGNOSIS — C50512 Malignant neoplasm of lower-outer quadrant of left female breast: Secondary | ICD-10-CM

## 2021-02-26 DIAGNOSIS — Z87891 Personal history of nicotine dependence: Secondary | ICD-10-CM | POA: Insufficient documentation

## 2021-02-26 DIAGNOSIS — Z79899 Other long term (current) drug therapy: Secondary | ICD-10-CM | POA: Diagnosis not present

## 2021-02-26 DIAGNOSIS — Z79810 Long term (current) use of selective estrogen receptor modulators (SERMs): Secondary | ICD-10-CM | POA: Diagnosis not present

## 2021-02-26 DIAGNOSIS — J029 Acute pharyngitis, unspecified: Secondary | ICD-10-CM

## 2021-02-26 DIAGNOSIS — Z170 Estrogen receptor positive status [ER+]: Secondary | ICD-10-CM

## 2021-02-26 DIAGNOSIS — Z800 Family history of malignant neoplasm of digestive organs: Secondary | ICD-10-CM | POA: Diagnosis not present

## 2021-02-26 DIAGNOSIS — Z9012 Acquired absence of left breast and nipple: Secondary | ICD-10-CM | POA: Insufficient documentation

## 2021-02-26 DIAGNOSIS — Z803 Family history of malignant neoplasm of breast: Secondary | ICD-10-CM | POA: Insufficient documentation

## 2021-02-26 NOTE — Progress Notes (Signed)
[facility] Follow up:    Joann Collins, Joann Grana, MD [place] [address] [place] [address]   DIAGNOSIS: Cancer Staging  Breast cancer of lower-outer quadrant of left female breast [facility]) Staging form: Breast, AJCC 7th Edition - Clinical: Stage IIB (T3, N0, M0) - Unsigned Laterality: Left - Pathologic stage from 03/09/2015: Stage IIA (T2, N0, cM0) - Signed by Joann Merle, MD on 03/30/2015 Laterality: Left Histologic grade (G): G1 Estrogen receptor status: Positive Progesterone receptor status: Positive HER2 status: Negative   SUMMARY OF ONCOLOGIC HISTORY: Oncology History Overview Note  Breast cancer of lower-outer quadrant of left female breast ([place])   Staging form: Breast, AJCC 7th Edition     Clinical: Stage IIB (T3, N0, M0) - Unsigned     Pathologic stage from 03/09/2015: Stage IIA (T2, N0, cM0) - Signed by Joann Merle, MD on 03/30/2015     Breast cancer of lower-outer quadrant of left female breast ([place])  01/27/2015 Mammogram   diagnostic mammogram and ultrasound showed architectural distortion within the left outer breast, 3:00 position. Area of biopsied architectural distortion measures approx 2.2 x 1.8 x 1.4 cm.    01/29/2015 Initial Biopsy   (L) breast mass biopsy showed low-grade invasive ductal carcinoma & DCIS; ER+ (95%), PR+ (80%), HER2 neg (ratio 1.22), Ki67 3%.    02/06/2015 Breast MRI   Non-masslike enhancement measuring 1.4 x 6.3 x 3.9 cm at the left breast 3 to 5:00 position. Enhancement extends to skin laterally with mild enhancement of adjacent skin, tumor extension in skin not excluded. No adenopathy. Bilat implants noted    03/09/2015 Surgery   (L) breast simple mastectomy with SLNB/immediate reconstruction (Joann Collins/Joann Collins): IDC, 4 cm, grade 1, extensive grade 2 DCIS with associated calcs; margins negative. 0/4 SLN. HER2 repeated, remains negative (ratio 1.12).  pT2,pN0: Stage IIA   03/09/2015 Oncotype testing   Recurrence score 9; 6% ROR (low  risk)   03/25/2015 -  Anti-estrogen oral therapy   Tamoxifen 20 mg daily   05/11/2015 Miscellaneous   Genetic testing. 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 Genes evaluated: ATM, BARD1, BRCA1, BRCA2, BRIP1, CDH1, CHEK2, EPCAM, FANCC, MLH1, MSH2, MSH6, NBN, PALB2, PMS2, PTEN, RAD51C, RAD51D, TP53, & XRCC2.    06/06/2015 Surgery   Removal of left breast tissue expander and placement of saline implant (Joann Collins)   08/17/2015 Miscellaneous   PALLAS Trial: Randomized to Ibrance arm.  Started Cycle #1 08/17/15; discontinued from this study due to lack of drive, fatigue and myalgias     CURRENT THERAPY: Tamoxifen daily  INTERVAL HISTORY: Joann Collins 56 y.o. female returns for evaluation of concern of tender lymphadenopathy in her right cervical chain that began a few days ago.  She also had a brief sore throat and some right ear pain.  She denies any fever or chills.  She has no breast concerns, headaches, or other issues.  She was recently on a zpak.    Patient Active Problem List   Diagnosis Date Noted   Genetic testing 06/01/2015   Family history of breast cancer    Family history of pancreatic cancer    Breast cancer of lower-outer quadrant of left female breast ([place]) 02/12/2015    is allergic to penicillins.  MEDICAL HISTORY: Past Medical History:  Diagnosis Date   Cancer [facility])    left breast cancer   Family history of breast cancer    Family history of pancreatic cancer    Spinal headache    with second c-section  SURGICAL HISTORY: Past Surgical History:  Procedure Laterality Date   ABDOMINAL HYSTERECTOMY     AUGMENTATION MAMMAPLASTY Right 2017   BREAST ENHANCEMENT SURGERY     2000   BREAST RECONSTRUCTION WITH PLACEMENT OF TISSUE EXPANDER AND FLEX HD (ACELLULAR HYDRATED DERMIS) Left 03/09/2015   Procedure: LEFT BREAST RECONSTRUCTION WITH PLACEMENT OF TISSUE EXPANDER AND POSSIBLE ACELLULAR DERMIS;  Surgeon: Joann Limbo, MD;  Location: [facility];  Service: Plastics;   Laterality: Left;   BREAST SURGERY     biopsy   CESAREAN SECTION     x 2   LIPOSUCTION WITH LIPOFILLING Left 06/05/2015   Procedure: LIPOSUCTION WITH LIPOFILLING TO LEFT CHEST;  Surgeon: Joann Limbo, MD;  Location: [place];  Service: Plastics;  Laterality: Left;   MASTECTOMY Left 03/09/2015   MASTECTOMY W/ SENTINEL NODE BIOPSY Left 03/09/2015   Procedure: LEFT MASTECTOMY WITH SENTINEL LYMPH NODE MAPPING;  Surgeon: Joann Messing III, MD;  Location: [place];  Service: General;  Laterality: Left;   REMOVAL OF TISSUE EXPANDER AND PLACEMENT OF IMPLANT Left 06/05/2015   Procedure: REMOVAL OF LEFT TISSUE EXPANDER AND PLACEMENT OF SALINE IMPLANT;  Surgeon: Joann Limbo, MD;  Location: [place];  Service: Plastics;  Laterality: Left;   TONSILLECTOMY     WISDOM TOOTH EXTRACTION      SOCIAL HISTORY: Social History   Socioeconomic History   Marital status: Married    Spouse name: Not on file   Number of children: 2   Years of education: Not on file   Highest education level: Not on file  Occupational History   Not on file  Tobacco Use   Smoking status: Former    Packs/day: 1.00    Years: 30.00    Pack years: 30.00    Types: Cigarettes    Quit date: 02/02/2015    Years since quitting: 6.0   Smokeless tobacco: Never  Substance and Sexual Activity   Alcohol use: No   Drug use: No   Sexual activity: Yes    Birth control/protection: Surgical  Other Topics Concern   Not on file  Social History Narrative   Not on file   Social Determinants of Health   Financial Resource Strain: Not on file  Food Insecurity: Not on file  Transportation Needs: Not on file  Physical Activity: Not on file  Stress: Not on file  Social Connections: Not on file  Intimate Partner Violence: Not on file    FAMILY HISTORY: Family History  Problem Relation Age of Onset   Breast cancer Maternal Aunt 45   Breast cancer Maternal Aunt 83   Heart disease Father    Heart  disease Maternal Uncle    Pancreatic cancer Paternal Aunt 83   Heart disease Paternal Uncle    Alzheimer's disease Maternal Grandmother    Breast cancer Maternal Grandmother    Heart disease Maternal Grandfather    Heart disease Paternal Grandmother    Heart disease Paternal Grandfather    Breast cancer Cousin 72   Breast cancer Cousin 44       Maternal cousin's daughter   Breast cancer Other 46   Heart disease Other    Prostate cancer Cousin        Maternal first cousin   Ovarian cancer Other        Mother's maternal grandmother    Review of Systems  Constitutional:  Positive for fatigue. Negative for appetite change, chills, fever and unexpected weight change.  HENT:   Negative  for hearing loss, lump/mass and trouble swallowing.   Eyes:  Negative for eye problems and icterus.  Respiratory:  Negative for chest tightness, cough and shortness of breath.   Cardiovascular:  Negative for chest pain, leg swelling and palpitations.  Gastrointestinal:  Negative for abdominal distention, abdominal pain, constipation, diarrhea, nausea and vomiting.  Endocrine: Negative for hot flashes.  Genitourinary:  Negative for difficulty urinating.   Musculoskeletal:  Negative for arthralgias.  Skin:  Negative for itching and rash.  Neurological:  Negative for dizziness, extremity weakness, headaches and numbness.  Hematological:  Negative for adenopathy. Does not bruise/bleed easily.  Psychiatric/Behavioral:  Negative for depression. The patient is not nervous/anxious.      PHYSICAL EXAMINATION  ECOG PERFORMANCE STATUS: 1 - Symptomatic but completely ambulatory  Vitals:   02/26/21 1453  BP: 109/73  Pulse: (!) 103  Resp: 16  Temp: 97.7 F (36.5 C)  SpO2: 100%    Physical Exam Constitutional:      General: She is not in acute distress.    Appearance: Normal appearance. She is not toxic-appearing.  HENT:     Head: Normocephalic and atraumatic.     Right Ear: Ear canal and external ear  normal.     Left Ear: Tympanic membrane, ear canal and external ear normal.     Ears:     Comments: Right TM with mild amt of fluid present, no sign of infection, BLM visible     Mouth/Throat:     Comments: Posterior oropharynx with small ulceration noted on right side Eyes:     General: No scleral icterus. Cardiovascular:     Rate and Rhythm: Normal rate and regular rhythm.     Pulses: Normal pulses.     Heart sounds: Normal heart sounds.  Pulmonary:     Effort: Pulmonary effort is normal.     Breath sounds: Normal breath sounds.  Abdominal:     General: Abdomen is flat. Bowel sounds are normal. There is no distension.     Palpations: Abdomen is soft.     Tenderness: There is no abdominal tenderness.  Musculoskeletal:        General: No swelling.     Cervical back: Neck supple.  Lymphadenopathy:     Cervical: No cervical adenopathy.  Skin:    General: Skin is warm and dry.     Findings: No rash.  Neurological:     General: No focal deficit present.     Mental Status: She is alert.  Psychiatric:        Mood and Affect: Mood normal.        Behavior: Behavior normal.    LABORATORY DATA:  CBC    Component Value Date/Time   WBC 8.6 06/03/2016 1045   RBC 4.05 06/03/2016 1045   HGB 12.0 06/03/2016 1045   HCT 35.3 06/03/2016 1045   PLT 220 06/03/2016 1045   MCV 87.2 06/03/2016 1045   MCH 29.6 06/03/2016 1045   MCHC 34.0 06/03/2016 1045   RDW 13.4 06/03/2016 1045   LYMPHSABS 2.4 06/03/2016 1045   MONOABS 0.7 06/03/2016 1045   EOSABS 0.1 06/03/2016 1045   BASOSABS 0.0 06/03/2016 1045    CMP     Component Value Date/Time   NA 139 06/03/2016 1045   K 4.4 06/03/2016 1045   CO2 24 06/03/2016 1045   GLUCOSE 97 06/03/2016 1045   BUN 9.2 06/03/2016 1045   CREATININE 0.8 06/03/2016 1045   CALCIUM 9.5 06/03/2016 1045   PROT 6.8 06/03/2016  1045   ALBUMIN 4.0 06/03/2016 1045   AST 15 06/03/2016 1045   ALT 9 06/03/2016 1045   ALKPHOS 46 06/03/2016 1045   BILITOT 0.23  06/03/2016 1045         ASSESSMENT and THERAPY PLAN:   No problem-specific Assessment & Plan notes found for this encounter.   No orders of the defined types were placed in this encounter.   All questions were answered. The patient knows to call the clinic with any problems, questions or concerns. We can certainly see the patient much sooner if necessary.  Total encounter time: 20 minutes in face to face visit time, chart review , lab review, care coordination, order entry, and documentation of the encounter.   Wilber Bihari, NP 03/01/21 6:40 PM Medical Oncology and Hematology [facility] [place], [place] [address] Tel. [phone]    Fax. [phone]  *Total Encounter Time as defined by the Centers for Medicare and Medicaid Services includes, in addition to the face-to-face time of a patient visit (documented in the note above) non-face-to-face time: obtaining and reviewing outside history, ordering and reviewing medications, tests or procedures, care coordination (communications with other health care professionals or caregivers) and documentation in the medical record.

## 2021-03-01 ENCOUNTER — Encounter: Payer: Self-pay | Admitting: Adult Health

## 2021-03-01 LAB — AEROBIC CULTURE W GRAM STAIN (SUPERFICIAL SPECIMEN)
Culture: NORMAL
Gram Stain: NONE SEEN

## 2021-03-01 NOTE — Assessment & Plan Note (Addendum)
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 Pathologic staging: T2 N0 stage II a Oncotype DX score: 9, low risk, 6% risk of recurrence Genetic testing: No mutations identified, VUS PMS-2 Breast reconstruction  -------------------------------------------------------------------------------------------------- Current treatment: Tamoxifen 20 mg daily started March 2017 (premenopausal based on blood work showing FSH5.3) Clinical trial: PALLAS and enrolled 08/10/2015, patient randomized to [place], discontinued 12/07/2015 due to fatigue and lack of drive  Tamoxifen toxicities: Denies any hot flashes or night sweats.  I let Sheketa know that I am not worried about cancer recurrence with her swollen lymph node.  I recommended respiratory culture of her throat and flonase for her ear pain, as it can likely be eustachian tube dysfunction.  She should f/u with PCP or ENT if she continues to have issues.

## 2021-03-18 NOTE — Progress Notes (Signed)
? ?Patient Care Team: ?[name], [name], MD as PCP - General (Family Medicine) ?Jovita Kussmaul, MD as Consulting Physician (General Surgery) ?Nicholas Lose, MD as Consulting Physician (Hematology and Oncology) ? ?DIAGNOSIS:  ?Encounter Diagnoses  ?Name Primary?  ? Malignant neoplasm of lower-outer quadrant of left breast of female, estrogen receptor positive ([place])   ? Acute midline thoracic back pain Yes  ? Generalized abdominal pain   ? ? ?SUMMARY OF ONCOLOGIC HISTORY: ?Oncology History Overview Note  ?Breast cancer of lower-outer quadrant of left female breast ([place]) ?  Staging form: Breast, AJCC 7th Edition ?    Clinical: Stage IIB (T3, N0, M0) - Unsigned ?    Pathologic stage from 03/09/2015: Stage IIA (T2, N0, cM0) - Signed by Truitt Merle, MD on 03/30/2015 ? ? ?  ?Breast cancer of lower-outer quadrant of left female breast [facility])  ?01/27/2015 Mammogram  ? diagnostic mammogram and ultrasound showed architectural distortion within the left outer breast, 3:00 position. Area of biopsied architectural distortion measures approx 2.2 x 1.8 x 1.4 cm.  ?  ?01/29/2015 Initial Biopsy  ? (L) breast mass biopsy showed low-grade invasive ductal carcinoma & DCIS; ER+ (95%), PR+ (80%), HER2 neg (ratio 1.22), Ki67 3%.  ?  ?02/06/2015 Breast MRI  ? Non-masslike enhancement measuring 1.4 x 6.3 x 3.9 cm at the left breast 3 to 5:00 position. Enhancement extends to skin laterally with mild enhancement of adjacent skin, tumor extension in skin not excluded. No adenopathy. Bilat implants noted  ?  ?03/09/2015 Surgery  ? (L) breast simple mastectomy with SLNB/immediate reconstruction ([place]): IDC, 4 cm, grade 1, extensive grade 2 DCIS with associated calcs; margins negative. 0/4 SLN. HER2 repeated, remains negative (ratio 1.12).  pT2,pN0: Stage IIA ?  ?03/09/2015 Oncotype testing  ? Recurrence score 9; 6% ROR (low risk) ?  ?03/25/2015 -  Anti-estrogen oral therapy  ? Tamoxifen 20 mg daily ?  ?05/11/2015 Miscellaneous  ? Genetic testing.  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 Genes evaluated: ATM, BARD1, BRCA1, BRCA2, BRIP1, CDH1, CHEK2, EPCAM, FANCC, MLH1, MSH2, MSH6, NBN, PALB2, PMS2, PTEN, RAD51C, RAD51D, TP53, & XRCC2.  ?  ?06/06/2015 Surgery  ? Removal of left breast tissue expander and placement of saline implant ([place]) ?  ?08/17/2015 Miscellaneous  ? PALLAS Trial: Randomized to Ibrance arm.  Started Cycle #1 08/17/15; discontinued from this study due to lack of drive, fatigue and myalgias ?  ? ? ?CHIEF COMPLIANT:Follow-up of left breast cancer on tamoxifen  ? ?INTERVAL HISTORY: Joann Collins is a 56 y.o. female with above-mentioned history of left breast cancer treated with bilateral mastectomies with reconstruction and who is currently on antiestrogen therapy with tamoxifen. Mammogram on 06/11/19 showed no evidence of malignancy bilaterally. She presents to  clinic today for annual follow-up She states she has no mood swings.  She states that she tolerating the tamoxifen. besides drying of the skin. She also states in the past yr. She has been having allergies really bad. She complains of her throat been swore for a while and she doesn't  understand why. She also said the lymph nodes on the right side of neck was swelling. ?She also  complain of back neck, abdominal and throat pain. ?ALLERGIES:  is allergic to penicillins. ? ?MEDICATIONS:  ?Current Outpatient Medications  ?Medication Sig Dispense Refill  ? acyclovir (ZOVIRAX) 800 MG tablet Take 800 mg by mouth daily.    ? Cyanocobalamin 1000 MCG/15ML LIQD 5000 micrograms daily 450 mL   ? Multiple Vitamin (MULTIVITAMIN WITH MINERALS)  TABS tablet Take 1 tablet by mouth daily.    ? tamoxifen (NOLVADEX) 20 MG tablet Take 1 tablet (20 MG) by mouth daily 90 tablet 3  ? ?No current facility-administered medications for this visit.  ? ? ?PHYSICAL EXAMINATION: ?ECOG PERFORMANCE STATUS: 1 - Symptomatic but completely ambulatory ? ?Vitals:  ? 03/22/21 1100  ?BP: 112/76  ?Pulse: 88  ?Resp: 17  ?Temp: (!)  97.5 ?F (36.4 ?C)  ?SpO2: 100%  ? ?Filed Weights  ? 03/22/21 1100  ?Weight: 114 lb 12.8 oz (52.1 kg)  ? ? ?BREAST: No palpable masses or nodules in either right or left breasts. No palpable axillary supraclavicular or infraclavicular adenopathy no breast tenderness or nipple discharge. (exam performed in the presence of a chaperone) ? ?LABORATORY DATA:  ?I have reviewed the data as listed ?CMP Latest Ref Rng & Units 06/03/2016 12/07/2015 10/12/2015  ?Glucose 70 - 140 mg/dl 97 95 97  ?BUN 7.0 - 26.0 mg/dL 9.2 9.3 7.5  ?Creatinine 0.6 - 1.1 mg/dL 0.8 0.8 0.8  ?Sodium 136 - 145 mEq/L 139 140 144  ?Potassium 3.5 - 5.1 mEq/L 4.4 5.1 4.8  ?CO2 22 - 29 mEq/L '24 25 25  ' ?Calcium 8.4 - 10.4 mg/dL 9.5 9.7 9.6  ?Total Protein 6.4 - 8.3 g/dL 6.8 7.1 7.5  ?Total Bilirubin 0.20 - 1.20 mg/dL 0.23 0.30 <0.30  ?Alkaline Phos 40 - 150 U/L 46 43 44  ?AST 5 - 34 U/L '15 18 17  ' ?ALT 0 - 55 U/L '9 11 13  ' ? ? ?Lab Results  ?Component Value Date  ? WBC 8.6 06/03/2016  ? HGB 12.0 06/03/2016  ? HCT 35.3 06/03/2016  ? MCV 87.2 06/03/2016  ? PLT 220 06/03/2016  ? NEUTROABS 5.4 06/03/2016  ? ? ?ASSESSMENT & PLAN:  ?Breast cancer of lower-outer quadrant of left female breast ([place]) ?Left breast mastectomy 03/09/2015 showed grade 1 invasive ductal carcinoma, 4 cm, 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  ?Pathologic staging: T2 N0 stage II a ?Oncotype DX score: 9, low risk, 6% risk of recurrence ?Genetic testing: No mutations identified, VUS PMS-2 ?Breast reconstruction  ?-------------------------------------------------------------------------------------------------- ?Current treatment: Tamoxifen 20 mg daily started March 2017 (premenopausal based on blood work showing [place] 5.3) ?Clinical trial: PALLAS and enrolled 08/10/2015, patient randomized to [place], discontinued 12/07/2015 due to fatigue and lack of drive ?  ?Tamoxifen toxicities: ?Denies  any hot flashes or night sweats. ?    ?Diffuse back pain, neck pain and throat pain and abdominal pain: We will get a PET CT scan to evaluate this further. ? ?Breast Cancer Surveillance: ?1. Breast Exam: 03/22/2021: Benign ?2.  Right breast mammogram  06/29/2020: No evidence of malignancy breast density category C ?  ?She got remarried.  ?Telephone visit after the PET scan to discuss results. ? ?If everything is negative, return to clinic in 1 year for follow-up ? ? ? ?Orders Placed This Encounter  ?Procedures  ? NM PET Image Initial (PI) Skull Base To Thigh  ?  Standing Status:   Future  ?  Standing Expiration Date:   03/22/2022  ?  Order Specific Question:   If indicated for the ordered procedure, I authorize the administration of a radiopharmaceutical per Radiology protocol  ?  Answer:   Yes  ?  Order Specific Question:   Is the patient pregnant?  ?  Answer:   No  ?  Order Specific Question:   Preferred imaging  location?  ?  Answer:   [facility]  ?  Order Specific Question:   Release to patient  ?  Answer:   Immediate  ? ?The patient has a good understanding of the overall plan. she agrees with it. she will call with any problems that may develop before the next visit here. ?Total time spent: 30 mins including face to face time and time spent for planning, charting and co-ordination of care ? ? Harriette Ohara, MD ?03/22/21 ? ?I, Gardiner Coins is acting as a Education administrator for Dr. Lindi Adie  ?  ?

## 2021-03-22 ENCOUNTER — Inpatient Hospital Stay: Payer: 59 | Attending: Adult Health | Admitting: Hematology and Oncology

## 2021-03-22 ENCOUNTER — Other Ambulatory Visit: Payer: Self-pay

## 2021-03-22 VITALS — BP 112/76 | HR 88 | Temp 97.5°F | Resp 17 | Ht 61.0 in | Wt 114.8 lb

## 2021-03-22 DIAGNOSIS — M546 Pain in thoracic spine: Secondary | ICD-10-CM | POA: Insufficient documentation

## 2021-03-22 DIAGNOSIS — C50512 Malignant neoplasm of lower-outer quadrant of left female breast: Secondary | ICD-10-CM | POA: Diagnosis present

## 2021-03-22 DIAGNOSIS — Z9013 Acquired absence of bilateral breasts and nipples: Secondary | ICD-10-CM | POA: Insufficient documentation

## 2021-03-22 DIAGNOSIS — Z79810 Long term (current) use of selective estrogen receptor modulators (SERMs): Secondary | ICD-10-CM | POA: Diagnosis not present

## 2021-03-22 DIAGNOSIS — Z170 Estrogen receptor positive status [ER+]: Secondary | ICD-10-CM | POA: Insufficient documentation

## 2021-03-22 DIAGNOSIS — R1084 Generalized abdominal pain: Secondary | ICD-10-CM | POA: Diagnosis not present

## 2021-03-22 NOTE — Assessment & Plan Note (Addendum)
Left breast mastectomy 03/09/2015 showed grade 1 invasive ductal carcinoma, 4 cm, 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? ?Pathologic staging: T2 N0 stage II a ?Oncotype DX score: 9, low risk, 6% risk of recurrence ?Genetic testing: No mutations identified, VUS PMS-2 ?Breast reconstruction  ?-------------------------------------------------------------------------------------------------- ?Current treatment: Tamoxifen 20 mg daily started March 2017 (premenopausal based on blood work showing FSH?5.3) ?Clinical trial: PALLAS and enrolled 08/10/2015, patient randomized to [place], discontinued 12/07/2015 due to fatigue and lack of drive ?? ?Tamoxifen toxicities: ?Denies any hot flashes or night sweats. ???  ?Diffuse back pain, neck pain and throat pain and abdominal pain: We will get a PET CT scan to evaluate this further. ? ?Breast Cancer Surveillance: ?1. Breast Exam: 03/22/2021: Benign ?2.??Right breast mammogram  06/29/2020: No evidence of malignancy breast density category C ?? ?She got remarried.? ?Telephone visit after the PET scan to discuss results. ? ?If everything is negative, return to clinic in 1 year for follow-up ?

## 2021-03-23 ENCOUNTER — Telehealth: Payer: Self-pay | Admitting: Hematology and Oncology

## 2021-03-23 NOTE — Telephone Encounter (Signed)
Scheduled appointment per 3/13 los. Patient is aware. ?

## 2021-04-09 ENCOUNTER — Ambulatory Visit (HOSPITAL_COMMUNITY)
Admission: RE | Admit: 2021-04-09 | Discharge: 2021-04-09 | Disposition: A | Discharge status: Home / Self Care | Payer: 59 | Source: Ambulatory Visit | Attending: Hematology and Oncology | Admitting: Hematology and Oncology

## 2021-04-09 DIAGNOSIS — Z170 Estrogen receptor positive status [ER+]: Secondary | ICD-10-CM | POA: Insufficient documentation

## 2021-04-09 DIAGNOSIS — R1084 Generalized abdominal pain: Secondary | ICD-10-CM | POA: Insufficient documentation

## 2021-04-09 DIAGNOSIS — C50512 Malignant neoplasm of lower-outer quadrant of left female breast: Secondary | ICD-10-CM | POA: Insufficient documentation

## 2021-04-09 DIAGNOSIS — M546 Pain in thoracic spine: Secondary | ICD-10-CM | POA: Insufficient documentation

## 2021-04-09 LAB — GLUCOSE, CAPILLARY: Glucose-Capillary: 99 mg/dL (ref 70–99)

## 2021-04-09 MED ORDER — FLUDEOXYGLUCOSE F - 18 (FDG) INJECTION
5.5000 | Freq: Once | INTRAVENOUS | Status: AC
  Administered 2021-04-09: 5.8 via INTRAVENOUS

## 2021-04-16 NOTE — Progress Notes (Signed)
HEMATOLOGY-ONCOLOGY TELEPHONE VISIT PROGRESS NOTE ? ?I connected with  on 04/30/21 at  9:15 AM EDT by telephone and verified that I am speaking with the correct person using two identifiers.  ?I discussed the limitations, risks, security and privacy concerns of performing an evaluation and management service by telephone and the availability of in person appointments.  ?I also discussed with the patient that there may be a patient responsible charge related to this service. The patient expressed understanding and agreed to proceed.  ? ?History of Present Illness:  Joann Collins is a 56 y.o. female with above-mentioned history of left breast cancer treated with bilateral mastectomies with reconstruction and who is currently on antiestrogen therapy with tamoxifen. She presents to the clinic via telephone follow-up to review the results of PET CT scan that was performed because of diffuse aches and pains in her body. ? ?Oncology History Overview Note  ?Breast cancer of lower-outer quadrant of left female breast ([place]) ?  Staging form: Breast, AJCC 7th Edition ?    Clinical: Stage IIB (T3, N0, M0) - Unsigned ?    Pathologic stage from 03/09/2015: Stage IIA (T2, N0, cM0) - Signed by Truitt Merle, MD on 03/30/2015 ? ? ? ?  ?Breast cancer of lower-outer quadrant of left female breast [facility])  ?01/27/2015 Mammogram  ? diagnostic mammogram and ultrasound showed architectural distortion within the left outer breast, 3:00 position. Area of biopsied architectural distortion measures approx 2.2 x 1.8 x 1.4 cm.  ? ?  ?01/29/2015 Initial Biopsy  ? (L) breast mass biopsy showed low-grade invasive ductal carcinoma & DCIS; ER+ (95%), PR+ (80%), HER2 neg (ratio 1.22), Ki67 3%.  ?  ?02/06/2015 Breast MRI  ? Non-masslike enhancement measuring 1.4 x 6.3 x 3.9 cm at the left breast 3 to 5:00 position. Enhancement extends to skin laterally with mild enhancement of adjacent skin, tumor extension in skin not excluded. No adenopathy. Bilat implants  noted  ? ?  ?03/09/2015 Surgery  ? (L) breast simple mastectomy with SLNB/immediate reconstruction (Toth/Thimmappa): IDC, 4 cm, grade 1, extensive grade 2 DCIS with associated calcs; margins negative. 0/4 SLN. HER2 repeated, remains negative (ratio 1.12).  pT2,pN0: Stage IIA ?  ?03/09/2015 Oncotype testing  ? Recurrence score 9; 6% ROR (low risk) ?  ?03/25/2015 -  Anti-estrogen oral therapy  ? Tamoxifen 20 mg daily ? ?  ?05/11/2015 Miscellaneous  ? Genetic testing. 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 Genes evaluated: ATM, BARD1, BRCA1, BRCA2, BRIP1, CDH1, CHEK2, EPCAM, FANCC, MLH1, MSH2, MSH6, NBN, PALB2, PMS2, PTEN, RAD51C, RAD51D, TP53, & XRCC2.  ?  ?06/06/2015 Surgery  ? Removal of left breast tissue expander and placement of saline implant (Thimmappa) ? ?  ?08/17/2015 Miscellaneous  ? PALLAS Trial: Randomized to Ibrance arm.  Started Cycle #1 08/17/15; discontinued from this study due to lack of drive, fatigue and myalgias ? ?  ? ? ?REVIEW OF SYSTEMS:   ?Constitutional: Denies fevers, chills or abnormal weight loss ?Continues to have diffuse pain in the lower back shoulders neck and other places. ?All other systems were reviewed with the patient and are negative. ?Observations/Objective:  ? ?  ?Assessment Plan:  ?Breast cancer of lower-outer quadrant of left female breast ([place]) ?Left breast mastectomy 03/09/2015 showed grade 1 invasive ductal carcinoma, 4 cm, 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  ?Pathologic staging: T2 N0 stage II a ?Oncotype DX score: 9, low risk, 6% risk of recurrence ?Genetic  testing: No mutations identified, VUS PMS-2 ?Breast reconstruction  ?-------------------------------------------------------------------------------------------------- ?Current treatment: Tamoxifen 20 mg daily started March 2017 (premenopausal based on blood work showing [place] 5.3) ?Clinical trial: PALLAS and enrolled  08/10/2015, patient randomized to [place], discontinued 12/07/2015 due to fatigue and lack of drive ?  ?Tamoxifen toxicities: ?Denies any hot flashes or night sweats.    ?Diffuse back pain, neck pain and throat pain and abdominal pain: PET CT scan 04/11/2021: Benign ?Does yoga, takes tumeric and advil. ?  ?Breast Cancer Surveillance: ?1. Breast Exam: 03/22/2021: Benign ?2.  Right breast mammogram  06/29/2020: No evidence of malignancy breast density category C ?  ?She got remarried.  ?PET/CT 04/11/2021: No findings for recurrent breast cancer or metastatic disease ?  ?Return to clinic in 1 year for follow-up ? ? ? ?I discussed the assessment and treatment plan with the patient. The patient was provided an opportunity to ask questions and all were answered. The patient agreed with the plan and demonstrated an understanding of the instructions. The patient was advised to call back or seek an in-person evaluation if the symptoms worsen or if the condition fails to improve as anticipated.  ? ?I provided 12 minutes of non-face-to-face time during this encounter. Harriette Ohara, MD   ?Earlie Server am scribing for Dr. Lindi Adie ? ?I have reviewed the above documentation for accuracy and completeness, and I agree with the above. ?  ?

## 2021-04-30 ENCOUNTER — Inpatient Hospital Stay: Payer: 59 | Attending: Adult Health | Admitting: Hematology and Oncology

## 2021-04-30 DIAGNOSIS — C50512 Malignant neoplasm of lower-outer quadrant of left female breast: Secondary | ICD-10-CM | POA: Diagnosis not present

## 2021-04-30 DIAGNOSIS — Z170 Estrogen receptor positive status [ER+]: Secondary | ICD-10-CM | POA: Diagnosis not present

## 2021-04-30 NOTE — Assessment & Plan Note (Signed)
Left breast mastectomy 03/09/2015 showed grade 1 invasive ductal carcinoma, 4 cm, 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? ?Pathologic staging: T2 N0 stage II a ?Oncotype DX score: 9, low risk, 6% risk of recurrence ?Genetic testing: No mutations identified, VUS PMS-2 ?Breast reconstruction  ?-------------------------------------------------------------------------------------------------- ?Current treatment: Tamoxifen 20 mg daily started March 2017 (premenopausal based on blood work showing FSH?5.3) ?Clinical trial: PALLAS and enrolled 08/10/2015, patient randomized to [place], discontinued 12/07/2015 due to fatigue and lack of drive ?? ?Tamoxifen toxicities: ?Denies any hot flashes or night sweats. ???  ?Diffuse back pain, neck pain and throat pain and abdominal pain: PET CT scan 04/11/2021: Benign ?? ?Breast Cancer Surveillance: ?1. Breast Exam: 03/22/2021: Benign ?2.??Right breast mammogram? 06/29/2020: No evidence of malignancy breast density category C ?? ?She got remarried.? ?PET/CT 04/11/2021: No findings for recurrent breast cancer or metastatic disease ?? ?Return to clinic in 1 year for follow-up ?

## 2021-06-21 ENCOUNTER — Other Ambulatory Visit: Payer: Self-pay | Admitting: Hematology and Oncology

## 2021-06-21 DIAGNOSIS — Z1231 Encounter for screening mammogram for malignant neoplasm of breast: Secondary | ICD-10-CM

## 2021-06-28 ENCOUNTER — Ambulatory Visit
Admission: RE | Admit: 2021-06-28 | Discharge: 2021-06-28 | Disposition: A | Discharge status: Home / Self Care | Payer: 59 | Source: Ambulatory Visit | Attending: Hematology and Oncology | Admitting: Hematology and Oncology

## 2021-06-28 DIAGNOSIS — Z1231 Encounter for screening mammogram for malignant neoplasm of breast: Secondary | ICD-10-CM

## 2021-11-17 NOTE — Progress Notes (Signed)
Patient Care Team: Nicholas Lose, MD as PCP - General (Hematology and Oncology) Jovita Kussmaul, MD as Consulting Physician (General Surgery) Nicholas Lose, MD as Consulting Physician (Hematology and Oncology)  DIAGNOSIS:  Encounter Diagnoses  Name Primary?   Malignant neoplasm of lower-outer quadrant of left breast of female, estrogen receptor positive ([place]) Yes   Pain in joint involving left pelvic region and thigh     SUMMARY OF ONCOLOGIC HISTORY: Oncology History Overview Note  Breast cancer of lower-outer quadrant of left female breast ([place])   Staging form: Breast, AJCC 7th Edition     Clinical: Stage IIB (T3, N0, M0) - Unsigned     Pathologic stage from 03/09/2015: Stage IIA (T2, N0, cM0) - Signed by Truitt Merle, MD on 03/30/2015     Breast cancer of lower-outer quadrant of left female breast ([place])  01/27/2015 Mammogram   diagnostic mammogram and ultrasound showed architectural distortion within the left outer breast, 3:00 position. Area of biopsied architectural distortion measures approx 2.2 x 1.8 x 1.4 cm.    01/29/2015 Initial Biopsy   (L) breast mass biopsy showed low-grade invasive ductal carcinoma & DCIS; ER+ (95%), PR+ (80%), HER2 neg (ratio 1.22), Ki67 3%.    02/06/2015 Breast MRI   Non-masslike enhancement measuring 1.4 x 6.3 x 3.9 cm at the left breast 3 to 5:00 position. Enhancement extends to skin laterally with mild enhancement of adjacent skin, tumor extension in skin not excluded. No adenopathy. Bilat implants noted    03/09/2015 Surgery   (L) breast simple mastectomy with SLNB/immediate reconstruction (Toth/Thimmappa): IDC, 4 cm, grade 1, extensive grade 2 DCIS with associated calcs; margins negative. 0/4 SLN. HER2 repeated, remains negative (ratio 1.12).  pT2,pN0: Stage IIA   03/09/2015 Oncotype testing   Recurrence score 9; 6% ROR (low risk)   03/25/2015 -  Anti-estrogen oral therapy   Tamoxifen 20 mg daily   05/11/2015 Miscellaneous   Genetic testing. VUS to  PMS2 c.40G>T (p.Ala14Ser). Genes evaluated: ATM, BARD1, BRCA1, BRCA2, BRIP1, CDH1, CHEK2, EPCAM, FANCC, MLH1, MSH2, MSH6, NBN, PALB2, PMS2, PTEN, RAD51C, RAD51D, TP53, & XRCC2.    06/06/2015 Surgery   Removal of left breast tissue expander and placement of saline implant (Thimmappa)   08/17/2015 Miscellaneous   PALLAS Trial: Randomized to Ibrance arm.  Started Cycle #1 08/17/15; discontinued from this study due to lack of drive, fatigue and myalgias     CHIEF COMPLIANT: Follow-up left breast cancer on tamoxifen  INTERVAL HISTORY: Joann Collins is a 56 y.o. female with above-mentioned history of left breast cancer treated with bilateral mastectomies with reconstruction and who is currently on antiestrogen therapy with tamoxifen. She presents to the clinic for a follow-up. She reports she has not been having an appetite. She has not been eating in the past 2 weeks. She complains of back pain on the left side. She says when she leans up against it it hurts. She complains of bad urine smell. She does say that she can't sleep at night. She does yoga and walks on a treadmill when she starts to hurt.  ALLERGIES:  is allergic to penicillins.  MEDICATIONS:  Current Outpatient Medications  Medication Sig Dispense Refill   acyclovir (ZOVIRAX) 800 MG tablet Take 800 mg by mouth daily.     Cyanocobalamin 1000 MCG/15ML LIQD 5000 micrograms daily 450 mL    Multiple Vitamin (MULTIVITAMIN WITH MINERALS) TABS tablet Take 1 tablet by mouth daily.     tamoxifen (NOLVADEX) 20 MG tablet Take 1 tablet (20  MG) by mouth daily 90 tablet 3   No current facility-administered medications for this visit.    PHYSICAL EXAMINATION: ECOG PERFORMANCE STATUS: 1 - Symptomatic but completely ambulatory  Vitals:   11/18/21 0920  BP: 107/76  Pulse: 85  Resp: 18  Temp: 97.9 F (36.6 C)  SpO2: 100%   Filed Weights   11/18/21 0920  Weight: 110 lb 3.2 oz (50 kg)     LABORATORY DATA:  I have reviewed the data as  listed    Latest Ref Rng & Units 06/03/2016   10:45 AM 12/07/2015    9:42 AM 10/12/2015    3:04 PM  CMP  Glucose 70 - 140 mg/dl 97  95  97   BUN 7.0 - 26.0 mg/dL 9.2  9.3  7.5   Creatinine 0.6 - 1.1 mg/dL 0.8  0.8  0.8   Sodium 136 - 145 mEq/L 139  140  144   Potassium 3.5 - 5.1 mEq/L 4.4  5.1  4.8   CO2 22 - 29 mEq/L _0 Calcium 8.4 - 10.4 mg/dL 9.5  9.7  9.6   Total Protein 6.4 - 8.3 g/dL 6.8  7.1  7.5   Total Bilirubin 0.20 - 1.20 mg/dL 0.23  0.30  <0.30   Alkaline Phos 40 - 150 U/L 46  43  44   AST 5 - 34 U/L _1 ALT 0 - 55 U/L _2 Lab Results  Component Value Date   WBC 8.6 06/03/2016   HGB 12.0 06/03/2016   HCT 35.3 06/03/2016   MCV 87.2 06/03/2016   PLT 220 06/03/2016   NEUTROABS 5.4 06/03/2016    ASSESSMENT & PLAN:  Breast cancer of lower-outer quadrant of left female breast ([place]) Left breast mastectomy 03/09/2015 showed grade 1 invasive ductal carcinoma, 4 cm, 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  Pathologic staging: T2 N0 stage II a Oncotype DX score: 9, low risk, 6% risk of recurrence Genetic testing: No mutations identified, VUS PMS-2 Breast reconstruction  -------------------------------------------------------------------------------------------------- Current treatment: Tamoxifen 20 mg daily started March 2017 (premenopausal based on blood work showing [place] 5.3) Clinical trial: PALLAS and enrolled 08/10/2015, patient randomized to [place], discontinued 12/07/2015 due to fatigue and lack of drive   Tamoxifen toxicities: Denies any hot flashes or night sweats.    Diffuse back pain, neck pain and throat pain and abdominal pain: PET CT scan 04/11/2021: Benign Does yoga, takes tumeric and advil.   Breast Cancer Surveillance: 1. Breast Exam: 11/18/2021: Benign 2.  Right breast mammogram  06/29/2021: No evidence of malignancy breast  density category C   She got remarried.  PET/CT 04/11/2021: No findings for recurrent breast cancer or metastatic disease   Severe pain in the left SI joint: It appears to be inflamed muscle.  She has been doing yoga and stretching but has not improved.  I will get an x-ray of her pelvis today.  I will also send for prednisone prescription.  She takes 4 tablets on day 1, 3 tablets on day 2, 2 tablets on day 3 and 1 tablet on day 4 and then discontinue.  Weight loss and loss of appetite: We will check thyroid function along with CBC CMP today.  Her recent PET scan did not show any evidence of metastatic disease therefore we are not repeating imaging studies.  Return to clinic in as previously scheduled.   Orders Placed This Encounter  Procedures   DG Pelvis 1-2 Views    Standing Status:   Future    Standing Expiration Date:   11/19/2022    Order Specific Question:   Reason for Exam (SYMPTOM  OR DIAGNOSIS REQUIRED)    Answer:   SI joint pain    Order Specific Question:   Is patient pregnant?    Answer:   No    Order Specific Question:   Preferred imaging location?    Answer[facility]   [facility]    Order Specific Question:   Release to patient    Answer:   Immediate   CBC with Differential ([place] Only)    Standing Status:   Future    Standing Expiration Date:   11/19/2022   CMP ([place] only)    Standing Status:   Future    Standing Expiration Date:   11/19/2022   Thyroid Panel With TSH    Standing Status:   Future    Standing Expiration Date:   11/18/2022   The patient has a good understanding of the overall plan. she agrees with it. she will call with any problems that may develop before the next visit here. Total time spent: 30 mins including face to face time and time spent for planning, charting and co-ordination of care   Harriette Ohara, MD 11/18/21    I Gardiner Coins am scribing for Dr. Lindi Adie  I have reviewed the above documentation for accuracy and  completeness, and I agree with the above.

## 2021-11-18 ENCOUNTER — Ambulatory Visit (HOSPITAL_COMMUNITY)
Admission: RE | Admit: 2021-11-18 | Discharge: 2021-11-18 | Disposition: A | Discharge status: Home / Self Care | Payer: 59 | Source: Ambulatory Visit | Attending: Hematology and Oncology | Admitting: Hematology and Oncology

## 2021-11-18 ENCOUNTER — Other Ambulatory Visit: Payer: Self-pay

## 2021-11-18 ENCOUNTER — Inpatient Hospital Stay: Payer: 59 | Attending: Hematology and Oncology | Admitting: Hematology and Oncology

## 2021-11-18 ENCOUNTER — Inpatient Hospital Stay: Payer: 59

## 2021-11-18 VITALS — BP 107/76 | HR 85 | Temp 97.9°F | Resp 18 | Ht 61.0 in | Wt 110.2 lb

## 2021-11-18 DIAGNOSIS — Z9013 Acquired absence of bilateral breasts and nipples: Secondary | ICD-10-CM | POA: Insufficient documentation

## 2021-11-18 DIAGNOSIS — Z79624 Long term (current) use of inhibitors of nucleotide synthesis: Secondary | ICD-10-CM | POA: Diagnosis not present

## 2021-11-18 DIAGNOSIS — M25552 Pain in left hip: Secondary | ICD-10-CM | POA: Insufficient documentation

## 2021-11-18 DIAGNOSIS — M2550 Pain in unspecified joint: Secondary | ICD-10-CM | POA: Insufficient documentation

## 2021-11-18 DIAGNOSIS — Z79899 Other long term (current) drug therapy: Secondary | ICD-10-CM | POA: Insufficient documentation

## 2021-11-18 DIAGNOSIS — Z79810 Long term (current) use of selective estrogen receptor modulators (SERMs): Secondary | ICD-10-CM | POA: Insufficient documentation

## 2021-11-18 DIAGNOSIS — C50512 Malignant neoplasm of lower-outer quadrant of left female breast: Secondary | ICD-10-CM | POA: Insufficient documentation

## 2021-11-18 DIAGNOSIS — Z170 Estrogen receptor positive status [ER+]: Secondary | ICD-10-CM

## 2021-11-18 LAB — CMP (CANCER CENTER ONLY)
ALT: 10 U/L (ref 0–44)
AST: 16 U/L (ref 15–41)
Albumin: 4.5 g/dL (ref 3.5–5.0)
Alkaline Phosphatase: 44 U/L (ref 38–126)
Anion gap: 4 — ABNORMAL LOW (ref 5–15)
BUN: 11 mg/dL (ref 6–20)
CO2: 29 mmol/L (ref 22–32)
Calcium: 9.4 mg/dL (ref 8.9–10.3)
Chloride: 105 mmol/L (ref 98–111)
Creatinine: 0.81 mg/dL (ref 0.44–1.00)
GFR, Estimated: >60 mL/min (ref >60)
Glucose, Bld: 91 mg/dL (ref 70–99)
Potassium: 4.2 mmol/L (ref 3.5–5.1)
Sodium: 138 mmol/L (ref 135–145)
Total Bilirubin: 0.4 mg/dL (ref 0.3–1.2)
Total Protein: 7.1 g/dL (ref 6.5–8.1)

## 2021-11-18 LAB — CBC WITH DIFFERENTIAL (CANCER CENTER ONLY)
Abs Immature Granulocytes: 0.01 10*3/uL (ref 0.00–0.07)
Basophils Absolute: 0.1 10*3/uL (ref 0.0–0.1)
Basophils Relative: 1 %
Eosinophils Absolute: 0.1 10*3/uL (ref 0.0–0.5)
Eosinophils Relative: 1 %
HCT: 38 % (ref 36.0–46.0)
Hemoglobin: 13.1 g/dL (ref 12.0–15.0)
Immature Granulocytes: 0 %
Lymphocytes Relative: 35 %
Lymphs Abs: 1.8 10*3/uL (ref 0.7–4.0)
MCH: 29.8 pg (ref 26.0–34.0)
MCHC: 34.5 g/dL (ref 30.0–36.0)
MCV: 86.4 fL (ref 80.0–100.0)
Monocytes Absolute: 0.5 10*3/uL (ref 0.1–1.0)
Monocytes Relative: 9 %
Neutro Abs: 2.7 10*3/uL (ref 1.7–7.7)
Neutrophils Relative %: 54 %
Platelet Count: 221 10*3/uL (ref 150–400)
RBC: 4.4 MIL/uL (ref 3.87–5.11)
RDW: 12.3 % (ref 11.5–15.5)
WBC Count: 5 10*3/uL (ref 4.0–10.5)
nRBC: 0 % (ref 0.0–0.2)

## 2021-11-18 MED ORDER — PREDNISONE 10 MG PO TABS: 10.0000 mg | ORAL_TABLET | Freq: Every day | ORAL | 0 refills | Status: AC

## 2021-11-18 NOTE — Assessment & Plan Note (Signed)
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  Pathologic staging: T2 N0 stage II a Oncotype DX score: 9, low risk, 6% risk of recurrence Genetic testing: No mutations identified, VUS PMS-2 Breast reconstruction  -------------------------------------------------------------------------------------------------- Current treatment: Tamoxifen 20 mg daily started March 2017 (premenopausal based on blood work showing [place] 5.3) Clinical trial: PALLAS and enrolled 08/10/2015, patient randomized to [place], discontinued 12/07/2015 due to fatigue and lack of drive   Tamoxifen toxicities: Denies any hot flashes or night sweats.    Diffuse back pain, neck pain and throat pain and abdominal pain: PET CT scan 04/11/2021: Benign Does yoga, takes tumeric and advil.   Breast Cancer Surveillance: 1. Breast Exam: 11/18/2021: Benign 2.  Right breast mammogram  06/29/2021: No evidence of malignancy breast density category C   She got remarried.  PET/CT 04/11/2021: No findings for recurrent breast cancer or metastatic disease   Return to clinic in 1 year for follow-up

## 2021-11-19 LAB — THYROID PANEL WITH TSH
Free Thyroxine Index: 3.1 (ref 1.2–4.9)
T3 Uptake Ratio: 34 % (ref 24–39)
T4, Total: 9 ug/dL (ref 4.5–12.0)
TSH: 0.885 u[IU]/mL (ref 0.450–4.500)

## 2022-02-11 ENCOUNTER — Other Ambulatory Visit: Payer: Self-pay | Admitting: Hematology and Oncology

## 2022-03-24 ENCOUNTER — Inpatient Hospital Stay: Payer: 59 | Attending: Hematology and Oncology | Admitting: Hematology and Oncology

## 2022-03-24 NOTE — Assessment & Plan Note (Deleted)
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  Pathologic staging: T2 N0 stage II a Oncotype DX score: 9, low risk, 6% risk of recurrence Genetic testing: No mutations identified, VUS PMS-2 Breast reconstruction  -------------------------------------------------------------------------------------------------- Current treatment: Tamoxifen 20 mg daily started March 2017 (premenopausal based on blood work showing [place] 5.3) Clinical trial: PALLAS and enrolled 08/10/2015, patient randomized to [place], discontinued 12/07/2015 due to fatigue and lack of drive   Tamoxifen toxicities: Denies any hot flashes or night sweats.    Diffuse back pain, neck pain and throat pain and abdominal pain: PET CT scan 04/11/2021: Benign Does yoga, takes tumeric and advil.   Breast Cancer Surveillance: 1. Breast Exam: 11/18/2021: Benign 2.  Right breast mammogram  06/29/2021: No evidence of malignancy breast density category C   She got remarried.  PET/CT 04/11/2021: No findings for recurrent breast cancer or metastatic disease Severe pain left SI joint: X-rays 11/21/2021: Benign Weight loss and loss of appetite: Thyroid function: Normal  Return to clinic in 1 year for follow-up

## 2022-05-16 ENCOUNTER — Other Ambulatory Visit: Payer: Self-pay | Admitting: Hematology and Oncology

## 2022-05-16 DIAGNOSIS — Z1231 Encounter for screening mammogram for malignant neoplasm of breast: Secondary | ICD-10-CM

## 2022-06-30 ENCOUNTER — Ambulatory Visit: Payer: 59

## 2022-07-20 ENCOUNTER — Ambulatory Visit
Admission: RE | Admit: 2022-07-20 | Discharge: 2022-07-20 | Disposition: A | Discharge status: Home / Self Care | Payer: 59 | Source: Ambulatory Visit | Attending: Adult Health | Admitting: Adult Health

## 2022-07-20 DIAGNOSIS — Z1231 Encounter for screening mammogram for malignant neoplasm of breast: Secondary | ICD-10-CM

## 2022-09-01 ENCOUNTER — Encounter: Payer: Self-pay | Admitting: Emergency Medicine

## 2022-09-01 ENCOUNTER — Encounter: Payer: Self-pay | Admitting: Transplant Hepatology

## 2023-01-19 ENCOUNTER — Other Ambulatory Visit: Payer: Self-pay | Admitting: Hematology and Oncology

## 2023-05-17 ENCOUNTER — Other Ambulatory Visit: Payer: Self-pay | Admitting: Hematology and Oncology

## 2023-05-18 ENCOUNTER — Telehealth: Payer: Self-pay | Admitting: Hematology and Oncology

## 2023-05-18 NOTE — Telephone Encounter (Signed)
 Left vm for pt to schedule a yearly f/u and to call back to schedule.

## 2023-05-24 ENCOUNTER — Other Ambulatory Visit: Payer: Self-pay | Admitting: Hematology and Oncology

## 2023-05-24 DIAGNOSIS — Z1231 Encounter for screening mammogram for malignant neoplasm of breast: Secondary | ICD-10-CM

## 2023-07-21 ENCOUNTER — Ambulatory Visit

## 2023-07-24 ENCOUNTER — Ambulatory Visit

## 2023-07-24 ENCOUNTER — Inpatient Hospital Stay: Payer: Self-pay | Attending: Hematology and Oncology | Admitting: Hematology and Oncology

## 2023-07-24 NOTE — Assessment & Plan Note (Deleted)
 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  Pathologic staging: T2 N0 stage II a Oncotype DX score: 9, low risk, 6% risk of recurrence Genetic testing: No mutations identified, VUS PMS-2 Breast reconstruction  -------------------------------------------------------------------------------------------------- Current treatment: Tamoxifen  20 mg daily started March 2017 (premenopausal based on blood work showing [place] 5.3) Clinical trial: PALLAS and enrolled 08/10/2015, patient randomized to I[place] , discontinued 12/07/2015 due to fatigue and lack of drive   Tamoxifen  toxicities: Denies any hot flashes or night sweats.    Diffuse back pain, neck pain and throat pain and abdominal pain: PET CT scan 04/11/2021: Benign Does yoga, takes tumeric and advil.   Breast Cancer Surveillance: 1. Breast Exam: 11/18/2021: Benign 2.  Right breast mammogram 07/21/2022: No evidence of malignancy breast density category C   She got remarried.  PET/CT 04/11/2021: No findings for recurrent breast cancer or metastatic disease   Severe pain in the left SI joint: It appears to be inflamed muscle.    Weight loss and loss of appetite:    Return to clinic

## 2023-08-02 ENCOUNTER — Other Ambulatory Visit: Payer: Self-pay | Admitting: Hematology and Oncology

## 2023-08-23 NOTE — Assessment & Plan Note (Signed)
 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  Pathologic staging: T2 N0 stage II a Oncotype DX score: 9, low risk, 6% risk of recurrence Genetic testing: No mutations identified, VUS PMS-2 Breast reconstruction  -------------------------------------------------------------------------------------------------- Current treatment: Tamoxifen  20 mg daily started March 2017 (premenopausal based on blood work showing FSH 5.3) Clinical trial: PALLAS and enrolled 08/10/2015, patient randomized to I[place] , discontinued 12/07/2015 due to fatigue and lack of drive   Tamoxifen  toxicities: Denies any hot flashes or night sweats.    Diffuse back pain, neck pain and throat pain and abdominal pain: PET CT scan 04/11/2021: Benign Does yoga, takes tumeric and advil.   Breast Cancer Surveillance: 1. Breast Exam: 08/24/23: Benign 2.  Right breast mammogram  07/21/22: No evidence of malignancy breast density category C   She got remarried.  PET/CT 04/11/2021: No findings for recurrent breast cancer or metastatic disease   Severe pain in the left SI joint: It appears to be inflamed muscle.  She has been doing yoga and stretching but has not improved.     Weight loss and loss of appetite: We will check thyroid  function along with CBC CMP today.

## 2023-08-24 ENCOUNTER — Inpatient Hospital Stay: Attending: Hematology and Oncology | Admitting: Hematology and Oncology

## 2023-08-24 DIAGNOSIS — Z170 Estrogen receptor positive status [ER+]: Secondary | ICD-10-CM

## 2023-08-24 DIAGNOSIS — C50512 Malignant neoplasm of lower-outer quadrant of left female breast: Secondary | ICD-10-CM | POA: Diagnosis not present

## 2023-08-24 MED ORDER — TAMOXIFEN CITRATE 20 MG PO TABS: 20.0000 mg | ORAL_TABLET | Freq: Every day | ORAL | 3 refills | Status: AC

## 2023-08-24 NOTE — Progress Notes (Signed)
 HEMATOLOGY-ONCOLOGY TELEPHONE VISIT PROGRESS NOTE  I connected with our patient on 08/24/23 at  1:30 PM EDT by telephone and verified that I am speaking with the correct person using two identifiers.  I discussed the limitations, risks, security and privacy concerns of performing an evaluation and management service by telephone and the availability of in person appointments.  I also discussed with the patient that there may be a patient responsible charge related to this service. The patient expressed understanding and agreed to proceed.   History of Present Illness:    History of Present Illness Joann Collins is a 58 year old female with breast cancer who presents for routine oncology follow-up.  She continues to take tamoxifen  daily since March 2017. She maintains her mammogram schedule, although a recent appointment was canceled due to service issues. She prefers to continue her regular visits with her oncologist.  She has experienced significant weight loss, currently weighing 96 pounds, which she attributes to stress. Her family reports she eats well, and there is no known reason for the weight loss. All blood work is normal, and previous PET scans have shown no abnormalities.  She has relocated to [place] , a two-hour drive from [place], but is willing to travel for her annual oncology visit. Her children remain in [place], facilitating her travel. She reports no new symptoms or concerns aside from the weight loss.    Oncology History Overview Note  Breast cancer of lower-outer quadrant of left female breast [facility])   Staging form: Breast, AJCC 7th Edition     Clinical: Stage IIB (T3, N0, M0) - Unsigned     Pathologic stage from 03/09/2015: Stage IIA (T2, N0, cM0) - Signed by Onita Mattock, MD on 03/30/2015     Breast cancer of lower-outer quadrant of left female breast (HCC)  01/27/2015 Mammogram   diagnostic mammogram and ultrasound showed architectural distortion within the left  outer breast, 3:00 position. Area of biopsied architectural distortion measures approx 2.2 x 1.8 x 1.4 cm.    01/29/2015 Initial Biopsy   (L) breast mass biopsy showed low-grade invasive ductal carcinoma & DCIS; ER+ (95%), PR+ (80%), HER2 neg (ratio 1.22), Ki67 3%.    02/06/2015 Breast MRI   Non-masslike enhancement measuring 1.4 x 6.3 x 3.9 cm at the left breast 3 to 5:00 position. Enhancement extends to skin laterally with mild enhancement of adjacent skin, tumor extension in skin not excluded. No adenopathy. Bilat implants noted    03/09/2015 Surgery   (L) breast simple mastectomy with SLNB/immediate reconstruction (Toth/Thimmappa): IDC, 4 cm, grade 1, extensive grade 2 DCIS with associated calcs; margins negative. 0/4 SLN. HER2 repeated, remains negative (ratio 1.12).  pT2,pN0: Stage IIA   03/09/2015 Oncotype testing   Recurrence score 9; 6% ROR (low risk)   03/25/2015 -  Anti-estrogen oral therapy   Tamoxifen  20 mg daily   05/11/2015 Miscellaneous   Genetic testing. 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 Genes evaluated: ATM, BARD1, BRCA1, BRCA2, BRIP1, CDH1, CHEK2, EPCAM, FANCC, MLH1, MSH2, MSH6, NBN, PALB2, PMS2, PTEN, RAD51C, RAD51D, TP53, & XRCC2.    06/06/2015 Surgery   Removal of left breast tissue expander and placement of saline implant (Thimmappa)   08/17/2015 Miscellaneous   PALLAS Trial: Randomized to Ibrance  arm.  Started Cycle #1 08/17/15; discontinued from this study due to lack of drive, fatigue and myalgias     REVIEW OF SYSTEMS:   Constitutional: Denies fevers, chills or abnormal weight loss All other systems were reviewed with the patient and are  negative. Observations/Objective:     Assessment Plan:  Breast cancer of lower-outer quadrant of left female breast (HCC) Left breast mastectomy 03/09/2015 showed grade 1 invasive ductal carcinoma, 4 cm, extensive ductal carcinoma in situ with associated calcification, grade 2, surgical margins were negative, 4 sentinel lymph nodes  were negative.  Pathologic staging: T2 N0 stage II a Oncotype DX score: 9, low risk, 6% risk of recurrence Genetic testing: No mutations identified, VUS PMS-2 Breast reconstruction  -------------------------------------------------------------------------------------------------- Current treatment: Tamoxifen  20 mg daily started March 2017 (premenopausal based on blood work showing FSH 5.3) Clinical trial: PALLAS and enrolled 08/10/2015, patient randomized to Ibrance , discontinued 12/07/2015 due to fatigue and lack of drive   Tamoxifen  toxicities: Denies any hot flashes or night sweats.    Diffuse back pain, neck pain and throat pain and abdominal pain: PET CT scan 04/11/2021: Benign Does yoga, takes tumeric and advil.   Breast Cancer Surveillance: Right breast mammogram  07/21/22: No evidence of malignancy breast density category C She moved to [place]     She got remarried.  PET/CT 04/11/2021: No findings for recurrent breast cancer or metastatic disease  Weight loss: 96 lbs   Assessment & Plan Estrogen receptor positive malignant neoplasm of lower-outer quadrant of left breast On tamoxifen  since March 2017, tolerating well. No recurrence indicated by PET scans and symptoms. Gardant Reveal blood test considered for early recurrence detection. - Continue tamoxifen  20 mg orally daily. - Order Gardant Reveal blood test for breast cancer early detection every six months. - Coordinate with the company to arrange blood draw at a convenient location.  Unintentional weight loss Reports unintentional weight loss, currently 96 pounds. Feels fine, attributes weight loss to stress. Previous tests show no underlying pathology.      I discussed the assessment and treatment plan with the patient. The patient was provided an opportunity to ask questions and all were answered. The patient agreed with the plan and demonstrated an understanding of the instructions. The patient was advised to call back  or seek an in-person evaluation if the symptoms worsen or if the condition fails to improve as anticipated.   I provided 20 minutes of non-face-to-face time during this encounter.  This includes time for charting and coordination of care   Naomi MARLA Chad, MD

## 2023-09-06 ENCOUNTER — Encounter: Payer: Self-pay | Admitting: Hematology and Oncology

## 2023-09-06 NOTE — Telephone Encounter (Signed)
 Per md orders entered for Guardant Reveal and all supported documents faxed to [phone]. Faxed confirmation was received.

## 2023-09-26 ENCOUNTER — Telehealth: Payer: Self-pay | Admitting: *Deleted

## 2023-09-26 NOTE — Telephone Encounter (Signed)
 Per MD request RN placed call to pt with recent Guardant Reveal results being negative.  Pt educated and verbalized understanding.

## 2023-09-28 ENCOUNTER — Encounter: Payer: Self-pay | Admitting: Hematology and Oncology

## 2024-08-26 ENCOUNTER — Ambulatory Visit: Admitting: Hematology and Oncology
